# Patient Record
Sex: Female | Born: 2000 | Race: White | Hispanic: No | Smoking: Never smoker
Health system: Southern US, Community
[De-identification: ages and names within clinical notes are randomized; demographics above are authoritative.]

## PROBLEM LIST (undated history)

## (undated) DIAGNOSIS — F419 Anxiety disorder, unspecified: Secondary | ICD-10-CM

## (undated) DIAGNOSIS — F32A Depression, unspecified: Secondary | ICD-10-CM

## (undated) HISTORY — DX: Anxiety disorder, unspecified: F41.9

## (undated) HISTORY — DX: Depression, unspecified: F32.A

---

## 2020-05-13 DIAGNOSIS — Z3483 Encounter for supervision of other normal pregnancy, third trimester: Secondary | ICD-10-CM | POA: Diagnosis not present

## 2020-05-29 DIAGNOSIS — O471 False labor at or after 37 completed weeks of gestation: Secondary | ICD-10-CM | POA: Diagnosis not present

## 2020-05-29 DIAGNOSIS — Z3403 Encounter for supervision of normal first pregnancy, third trimester: Secondary | ICD-10-CM | POA: Diagnosis not present

## 2020-05-30 DIAGNOSIS — O2623 Pregnancy care for patient with recurrent pregnancy loss, third trimester: Secondary | ICD-10-CM | POA: Diagnosis not present

## 2020-05-30 DIAGNOSIS — Z3A38 38 weeks gestation of pregnancy: Secondary | ICD-10-CM | POA: Diagnosis not present

## 2020-05-30 DIAGNOSIS — O99344 Other mental disorders complicating childbirth: Secondary | ICD-10-CM | POA: Diagnosis not present

## 2020-05-30 DIAGNOSIS — Z3A00 Weeks of gestation of pregnancy not specified: Secondary | ICD-10-CM | POA: Diagnosis not present

## 2020-05-30 DIAGNOSIS — F419 Anxiety disorder, unspecified: Secondary | ICD-10-CM | POA: Diagnosis not present

## 2020-05-30 DIAGNOSIS — Z20822 Contact with and (suspected) exposure to covid-19: Secondary | ICD-10-CM | POA: Diagnosis not present

## 2020-05-30 DIAGNOSIS — O4212 Full-term premature rupture of membranes, onset of labor more than 24 hours following rupture: Secondary | ICD-10-CM | POA: Diagnosis not present

## 2020-06-20 DIAGNOSIS — O906 Postpartum mood disturbance: Secondary | ICD-10-CM | POA: Diagnosis not present

## 2020-06-21 DIAGNOSIS — L910 Hypertrophic scar: Secondary | ICD-10-CM | POA: Diagnosis not present

## 2020-06-27 DIAGNOSIS — L910 Hypertrophic scar: Secondary | ICD-10-CM | POA: Diagnosis not present

## 2020-07-22 DIAGNOSIS — Z30430 Encounter for insertion of intrauterine contraceptive device: Secondary | ICD-10-CM | POA: Diagnosis not present

## 2020-07-25 DIAGNOSIS — O906 Postpartum mood disturbance: Secondary | ICD-10-CM | POA: Diagnosis not present

## 2020-07-31 DIAGNOSIS — L910 Hypertrophic scar: Secondary | ICD-10-CM | POA: Diagnosis not present

## 2020-08-05 DIAGNOSIS — Z30430 Encounter for insertion of intrauterine contraceptive device: Secondary | ICD-10-CM | POA: Diagnosis not present

## 2020-08-05 DIAGNOSIS — O906 Postpartum mood disturbance: Secondary | ICD-10-CM | POA: Diagnosis not present

## 2020-08-29 DIAGNOSIS — L910 Hypertrophic scar: Secondary | ICD-10-CM | POA: Diagnosis not present

## 2020-09-02 DIAGNOSIS — Z30431 Encounter for routine checking of intrauterine contraceptive device: Secondary | ICD-10-CM | POA: Diagnosis not present

## 2020-11-21 DIAGNOSIS — L910 Hypertrophic scar: Secondary | ICD-10-CM | POA: Diagnosis not present

## 2020-12-03 DIAGNOSIS — L910 Hypertrophic scar: Secondary | ICD-10-CM | POA: Diagnosis not present

## 2020-12-14 DIAGNOSIS — N390 Urinary tract infection, site not specified: Secondary | ICD-10-CM | POA: Diagnosis not present

## 2020-12-17 ENCOUNTER — Emergency Department
Admission: EM | Admit: 2020-12-17 | Discharge: 2020-12-17 | Disposition: A | Discharge status: Home / Self Care | Payer: BLUE CROSS/BLUE SHIELD | Attending: Emergency Medicine | Admitting: Emergency Medicine

## 2020-12-17 ENCOUNTER — Other Ambulatory Visit: Payer: Self-pay

## 2020-12-17 ENCOUNTER — Encounter: Payer: Self-pay | Admitting: Emergency Medicine

## 2020-12-17 ENCOUNTER — Emergency Department: Payer: BLUE CROSS/BLUE SHIELD

## 2020-12-17 DIAGNOSIS — R1031 Right lower quadrant pain: Secondary | ICD-10-CM | POA: Diagnosis not present

## 2020-12-17 DIAGNOSIS — K7689 Other specified diseases of liver: Secondary | ICD-10-CM | POA: Diagnosis not present

## 2020-12-17 DIAGNOSIS — N83202 Unspecified ovarian cyst, left side: Secondary | ICD-10-CM | POA: Diagnosis not present

## 2020-12-17 DIAGNOSIS — R102 Pelvic and perineal pain: Secondary | ICD-10-CM | POA: Diagnosis not present

## 2020-12-17 DIAGNOSIS — Z87440 Personal history of urinary (tract) infections: Secondary | ICD-10-CM | POA: Diagnosis not present

## 2020-12-17 DIAGNOSIS — N200 Calculus of kidney: Secondary | ICD-10-CM | POA: Diagnosis not present

## 2020-12-17 DIAGNOSIS — L910 Hypertrophic scar: Secondary | ICD-10-CM | POA: Diagnosis not present

## 2020-12-17 DIAGNOSIS — R1030 Lower abdominal pain, unspecified: Secondary | ICD-10-CM

## 2020-12-17 LAB — URINALYSIS, COMPLETE (UACMP) WITH MICROSCOPIC
Bilirubin Urine: NEGATIVE
Glucose, UA: NEGATIVE mg/dL
Ketones, ur: NEGATIVE mg/dL
Nitrite: NEGATIVE
Protein, ur: NEGATIVE mg/dL
Specific Gravity, Urine: 1.015 (ref 1.005–1.030)
pH: 6 (ref 5.0–8.0)

## 2020-12-17 LAB — WET PREP, GENITAL
Clue Cells Wet Prep HPF POC: NONE SEEN
Sperm: NONE SEEN
Trich, Wet Prep: NONE SEEN
Yeast Wet Prep HPF POC: NONE SEEN

## 2020-12-17 LAB — CHLAMYDIA/NGC RT PCR (ARMC ONLY)
Chlamydia Tr: NOT DETECTED
N gonorrhoeae: NOT DETECTED

## 2020-12-17 LAB — POC URINE PREG, ED: Preg Test, Ur: NEGATIVE

## 2020-12-17 MED ORDER — SODIUM CHLORIDE 0.9 % IV SOLN: 2.0000 g | INTRAVENOUS | Status: DC

## 2020-12-17 MED ORDER — IBUPROFEN 600 MG PO TABS
600.0000 mg | ORAL_TABLET | Freq: Once | ORAL | Status: AC
  Administered 2020-12-17: 600 mg via ORAL
  Filled 2020-12-17: qty 1

## 2020-12-17 MED ORDER — HYDROCODONE-ACETAMINOPHEN 5-325 MG PO TABS
1.0000 | ORAL_TABLET | Freq: Once | ORAL | Status: AC
  Administered 2020-12-17: 1 via ORAL
  Filled 2020-12-17: qty 1

## 2020-12-17 MED ORDER — HYDROCODONE-ACETAMINOPHEN 5-325 MG PO TABS: 1.0000 | ORAL_TABLET | ORAL | 0 refills | Status: AC | PRN

## 2020-12-17 NOTE — ED Provider Notes (Signed)
-----------------------------------------   9:36 PM on 12/17/2020 -----------------------------------------  Patient seen in conjunction with physician assistant Greig Right.  I have personally seen and evaluated the patient.  Patient has mild right very low tenderness to palpation.  Patient's work-up is overall reassuring.  She does have small cysts on her ovary CT scans essentially negative for acute abnormality.  Given the patient's reassuring work-up I believe the patient is safe for discharge home.  We will prescribe a short course of pain medication have her follow-up with her PCP.  Patient agreeable to plan of care.   Minna Antis, MD 12/17/20 2137

## 2020-12-17 NOTE — ED Triage Notes (Signed)
Pt to ED via POV with c/o lower abd/pelvic pain x 2 weeks, pt seen at [facility] on Sunday and dx with UTI and given abx and told to come to ED if symptoms persisted.

## 2020-12-17 NOTE — ED Provider Notes (Signed)
[facility] Emergency Department Provider Note  ____________________________________________   Event Date/Time   First MD Initiated Contact with Patient 12/17/20 1638     (approximate)  I have reviewed the triage vital signs and the nursing notes.   HISTORY  Chief Complaint Pelvic Pain    HPI Linda Duran is a 20 y.o. female presents emergency department complaining of right lower quadrant pain.  States she has had lower abdominal pain for 2 weeks.  Seen in urgent care on Sunday and diagnosed with a UTI and given antibiotic.  States pain is become worse.  She denies fever or chills.  No vomiting or diarrhea.  No vaginal discharge.  Patient states she has IUD and has not changed sexual partners    History reviewed. No pertinent past medical history.  There are no problems to display for this patient.   History reviewed. No pertinent surgical history.  Prior to Admission medications   Not on File    Allergies Patient has no known allergies.  No family history on file.  Social History Social History   Tobacco Use  . Smoking status: Never Smoker  . Smokeless tobacco: Never Used  Substance Use Topics  . Alcohol use: Yes    Comment: 1 glass wine/week  . Drug use: Never    Review of Systems  Constitutional: No fever/chills Eyes: No visual changes. ENT: No sore throat. Respiratory: Denies cough Cardiovascular: Denies chest pain Gastrointestinal: Positive abdominal pain Genitourinary: Negative for dysuria. Musculoskeletal: Negative for back pain. Skin: Negative for rash. Psychiatric: no mood changes,     ____________________________________________   PHYSICAL EXAM:  VITAL SIGNS: ED Triage Vitals [12/17/20 1635]  Enc Vitals Group     BP (!) 152/93     Pulse Rate 95     Resp 20     Temp 98.4 F (36.9 C)     Temp Source Oral     SpO2 96 %     Weight 180 lb (81.6 kg)     Height 5\' 4"  (1.626 m)     Head Circumference       Peak Flow      Pain Score 8     Pain Loc      Pain Edu?      Excl. in GC?     Constitutional: Alert and oriented. Well appearing and in no acute distress. Eyes: Conjunctivae are normal.  Head: Atraumatic. Nose: No congestion/rhinnorhea. Mouth/Throat: Mucous membranes are moist.   Neck:  supple no lymphadenopathy noted Cardiovascular: Normal rate, regular rhythm. Heart sounds are normal Respiratory: Normal respiratory effort.  No retractions, lungs c t a  Abd: soft tender in the right lower quadrant, bs normal all 4 quad GU: External pelvic exam does not show any herpetic lesions, slight discharge noted, bimanual shows the right side of the abdomen to be tender little bit higher than the ovary Musculoskeletal: FROM all extremities, warm and well perfused Neurologic:  Normal speech and language.  Skin:  Skin is warm, dry and intact. No rash noted. Psychiatric: Mood and affect are normal. Speech and behavior are normal.  ____________________________________________   LABS (all labs ordered are listed, but only abnormal results are displayed)  Labs Reviewed  CHLAMYDIA/NGC RT PCR ([facility] ONLY)  WET PREP, GENITAL  POC URINE PREG, ED   ____________________________________________   ____________________________________________  RADIOLOGY  Ultrasound pelvis  ____________________________________________   PROCEDURES  Procedure(s) performed: No  Procedures    ____________________________________________   INITIAL IMPRESSION / ASSESSMENT  AND PLAN / ED COURSE  Pertinent labs & imaging results that were available during my care of the patient were reviewed by me and considered in my medical decision making (see chart for details).   Patient is 20 year old female presents with pelvic pain.  See HPI.  Physical exam shows patient stable  DDx: STD, PID, UTI, ovarian cyst, acute appendicitis  Urinalysis has small amount of hemoglobin, small amount of leuks, and  rare bacteria, is amorphus crystals noted, wet prep shows few WBCs, POC pregnancy is negative GC/committee is pending  Ultrasound pelvis complete with transvaginal torsion r/o   Ultrasound of the pelvis does not show torsion.  Does show a left ovarian cyst.  This was reviewed by me confirmed by radiology  On reexamination the patient continues to be tender in the right lower quadrant.  At this time I feel this warrants CT abdomen/pelvis.  Will do without contrast secondary to contrast shortage.  Care is being transferred to Dr. Gearldine Bienenstock at this time.  Linda Duran was evaluated in Emergency Department on 12/17/2020 for the symptoms described in the history of present illness. She was evaluated in the context of the global COVID-19 pandemic, which necessitated consideration that the patient might be at risk for infection with the SARS-CoV-2 virus that causes COVID-19. Institutional protocols and algorithms that pertain to the evaluation of patients at risk for COVID-19 are in a state of rapid change based on information released by regulatory bodies including the CDC and federal and state organizations. These policies and algorithms were followed during the patient's care in the ED.    As part of my medical decision making, I reviewed the following data within the electronic MEDICAL RECORD NUMBER Nursing notes reviewed and incorporated, Labs reviewed , Old chart reviewed, Patient signed out to , Radiograph reviewed , Evaluated by EM attending , Notes from prior ED visits and [place] Controlled Substance Database  ____________________________________________   FINAL CLINICAL IMPRESSION(S) / ED DIAGNOSES  Final diagnoses:  Lower abdominal pain      NEW MEDICATIONS STARTED DURING THIS VISIT:  New Prescriptions   No medications on file     Note:  This document was prepared using Dragon voice recognition software and may include unintentional dictation errors.    Faythe Ghee, PA-C 12/17/20  1934    Minna Antis, MD 12/17/20 1945

## 2021-01-14 DIAGNOSIS — L910 Hypertrophic scar: Secondary | ICD-10-CM | POA: Diagnosis not present

## 2021-03-27 DIAGNOSIS — L910 Hypertrophic scar: Secondary | ICD-10-CM | POA: Diagnosis not present

## 2021-06-18 ENCOUNTER — Ambulatory Visit
Admission: EM | Admit: 2021-06-18 | Discharge: 2021-06-18 | Disposition: A | Discharge status: Home / Self Care | Payer: BLUE CROSS/BLUE SHIELD | Attending: Family Medicine | Admitting: Family Medicine

## 2021-06-18 ENCOUNTER — Encounter: Payer: Self-pay | Admitting: Emergency Medicine

## 2021-06-18 ENCOUNTER — Other Ambulatory Visit: Payer: Self-pay

## 2021-06-18 DIAGNOSIS — J101 Influenza due to other identified influenza virus with other respiratory manifestations: Secondary | ICD-10-CM

## 2021-06-18 MED ORDER — PROMETHAZINE-DM 6.25-15 MG/5ML PO SYRP: 5.0000 mL | ORAL_SOLUTION | Freq: Four times a day (QID) | ORAL | 0 refills | Status: DC | PRN

## 2021-06-18 MED ORDER — FLUTICASONE PROPIONATE 50 MCG/ACT NA SUSP: 1.0000 | Freq: Two times a day (BID) | NASAL | 0 refills | Status: DC

## 2021-06-18 NOTE — ED Triage Notes (Signed)
Pt reports was diagnosed with flu A on Monday and reports is not feeling better. Pt reports continued bilateral ear pain, cough, body aches.

## 2021-06-18 NOTE — ED Provider Notes (Signed)
[facility]    CSN: [id number] Arrival date & time: 06/18/21  0813      History   Chief Complaint Chief Complaint  Patient presents with   Generalized Body Aches    HPI Linda Duran is a 20 y.o. female.   Presenting today with 5-day history of fever, body aches, chills, cough, congestion, headache, fatigue, decreased appetite.  She was seen on Monday at a different urgent care and diagnosed with influenza.  She has been taking over-the-counter cold and flu medications, Benadryl with minimal relief of symptoms.  She was to return to work tomorrow but is not feeling any better.  She denies significant chest pain, shortness of breath, intolerance to p.o.  No known pertinent chronic medical problems.   History reviewed. No pertinent past medical history.  There are no problems to display for this patient.   History reviewed. No pertinent surgical history.  OB History   No obstetric history on file.      Home Medications    Prior to Admission medications   Medication Sig Start Date End Date Taking? Authorizing Provider  fluticasone (FLONASE) 50 MCG/ACT nasal spray Place 1 spray into both nostrils 2 (two) times daily. 06/18/21  Yes Particia Nearing, PA-C  promethazine-dextromethorphan (PROMETHAZINE-DM) 6.25-15 MG/5ML syrup Take 5 mLs by mouth 4 (four) times daily as needed. 06/18/21  Yes Particia Nearing, PA-C  HYDROcodone-acetaminophen (NORCO/VICODIN) 5-325 MG tablet Take 1 tablet by mouth every 4 (four) hours as needed for moderate pain. Patient not taking: Reported on 06/18/2021 12/17/20 12/17/21  Minna Antis, MD    Family History History reviewed. No pertinent family history.  Social History Social History   Tobacco Use   Smoking status: Never   Smokeless tobacco: Never  Substance Use Topics   Alcohol use: Yes    Comment: 1 glass wine/week   Drug use: Never     Allergies   Patient has no known allergies.   Review of Systems Review  of Systems Per HPI  Physical Exam Triage Vital Signs ED Triage Vitals [06/18/21 0847]  Enc Vitals Group     BP (!) 97/58     Pulse Rate 100     Resp 18     Temp 98.4 F (36.9 C)     Temp Source Oral     SpO2 97 %     Weight 180 lb (81.6 kg)     Height 5\' 5"  (1.651 m)     Head Circumference      Peak Flow      Pain Score 4     Pain Loc      Pain Edu?      Excl. in GC?    No data found.  Updated Vital Signs BP (!) 97/58 (BP Location: Right Arm)   Pulse 100   Temp 98.4 F (36.9 C) (Oral)   Resp 18   Ht 5\' 5"  (1.651 m)   Wt 180 lb (81.6 kg)   SpO2 97%   BMI 29.95 kg/m   Visual Acuity Right Eye Distance:   Left Eye Distance:   Bilateral Distance:    Right Eye Near:   Left Eye Near:    Bilateral Near:     Physical Exam Vitals and nursing note reviewed.  Constitutional:      Appearance: Normal appearance.  HENT:     Head: Atraumatic.     Right Ear: Tympanic membrane and external ear normal.     Left Ear: Tympanic membrane  and external ear normal.     Nose: Rhinorrhea present.     Mouth/Throat:     Mouth: Mucous membranes are moist.     Pharynx: Posterior oropharyngeal erythema present.  Eyes:     Extraocular Movements: Extraocular movements intact.     Conjunctiva/sclera: Conjunctivae normal.  Cardiovascular:     Rate and Rhythm: Normal rate and regular rhythm.     Heart sounds: Normal heart sounds.  Pulmonary:     Effort: Pulmonary effort is normal.     Breath sounds: Normal breath sounds.  Abdominal:     General: Bowel sounds are normal. There is no distension.     Palpations: Abdomen is soft.     Tenderness: There is no abdominal tenderness. There is no guarding.  Musculoskeletal:        General: Normal range of motion.     Cervical back: Normal range of motion and neck supple.  Skin:    General: Skin is warm and dry.  Neurological:     Mental Status: She is alert and oriented to person, place, and time.  Psychiatric:        Mood and Affect:  Mood normal.        Thought Content: Thought content normal.     UC Treatments / Results  Labs (all labs ordered are listed, but only abnormal results are displayed) Labs Reviewed - No data to display  EKG   Radiology No results found.  Procedures Procedures (including critical care time)  Medications Ordered in UC Medications - No data to display  Initial Impression / Assessment and Plan / UC Course  I have reviewed the triage vital signs and the nursing notes.  Pertinent labs & imaging results that were available during my care of the patient were reviewed by me and considered in my medical decision making (see chart for details).     Overall vital signs and exam reassuring, and symptoms do appear to be slowly improving.  She has not had a fever since 4 days ago and it is mainly the cough that is lingering.  We will treat with Phenergan DM, Flonase, continued over-the-counter supportive medications and home care.  Work note extended.  Return for worsening symptoms.  Final Clinical Impressions(s) / UC Diagnoses   Final diagnoses:  Influenza A   Discharge Instructions   None    ED Prescriptions     Medication Sig Dispense Auth. Provider   promethazine-dextromethorphan (PROMETHAZINE-DM) 6.25-15 MG/5ML syrup Take 5 mLs by mouth 4 (four) times daily as needed. 100 mL Particia Nearing, PA-C   fluticasone [facility]) 50 MCG/ACT nasal spray Place 1 spray into both nostrils 2 (two) times daily. 16 g Particia Nearing, [place]      PDMP not reviewed this encounter.   Roosvelt Maser Me[place], [place] 06/18/21 [phone]

## 2021-10-15 DIAGNOSIS — J019 Acute sinusitis, unspecified: Secondary | ICD-10-CM | POA: Diagnosis not present

## 2021-10-15 DIAGNOSIS — B9689 Other specified bacterial agents as the cause of diseases classified elsewhere: Secondary | ICD-10-CM | POA: Diagnosis not present

## 2021-11-28 DIAGNOSIS — F411 Generalized anxiety disorder: Secondary | ICD-10-CM | POA: Diagnosis not present

## 2021-11-28 DIAGNOSIS — F331 Major depressive disorder, recurrent, moderate: Secondary | ICD-10-CM | POA: Diagnosis not present

## 2021-12-11 DIAGNOSIS — F411 Generalized anxiety disorder: Secondary | ICD-10-CM | POA: Diagnosis not present

## 2021-12-11 DIAGNOSIS — F331 Major depressive disorder, recurrent, moderate: Secondary | ICD-10-CM | POA: Diagnosis not present

## 2021-12-26 DIAGNOSIS — F411 Generalized anxiety disorder: Secondary | ICD-10-CM | POA: Diagnosis not present

## 2021-12-26 DIAGNOSIS — F331 Major depressive disorder, recurrent, moderate: Secondary | ICD-10-CM | POA: Diagnosis not present

## 2022-01-06 DIAGNOSIS — F411 Generalized anxiety disorder: Secondary | ICD-10-CM | POA: Diagnosis not present

## 2022-01-06 DIAGNOSIS — F331 Major depressive disorder, recurrent, moderate: Secondary | ICD-10-CM | POA: Diagnosis not present

## 2022-01-20 DIAGNOSIS — F331 Major depressive disorder, recurrent, moderate: Secondary | ICD-10-CM | POA: Diagnosis not present

## 2022-01-20 DIAGNOSIS — F411 Generalized anxiety disorder: Secondary | ICD-10-CM | POA: Diagnosis not present

## 2022-01-23 DIAGNOSIS — S8011XA Contusion of right lower leg, initial encounter: Secondary | ICD-10-CM | POA: Diagnosis not present

## 2022-01-23 DIAGNOSIS — M79661 Pain in right lower leg: Secondary | ICD-10-CM | POA: Diagnosis not present

## 2022-02-12 DIAGNOSIS — F331 Major depressive disorder, recurrent, moderate: Secondary | ICD-10-CM | POA: Diagnosis not present

## 2022-02-12 DIAGNOSIS — F411 Generalized anxiety disorder: Secondary | ICD-10-CM | POA: Diagnosis not present

## 2022-02-23 ENCOUNTER — Encounter: Payer: Self-pay | Admitting: Emergency Medicine

## 2022-02-23 ENCOUNTER — Ambulatory Visit
Admission: EM | Admit: 2022-02-23 | Discharge: 2022-02-23 | Disposition: A | Discharge status: Home / Self Care | Payer: BC Managed Care – PPO | Attending: Nurse Practitioner | Admitting: Nurse Practitioner

## 2022-02-23 DIAGNOSIS — R42 Dizziness and giddiness: Secondary | ICD-10-CM | POA: Insufficient documentation

## 2022-02-23 DIAGNOSIS — R110 Nausea: Secondary | ICD-10-CM | POA: Insufficient documentation

## 2022-02-23 DIAGNOSIS — R5383 Other fatigue: Secondary | ICD-10-CM | POA: Diagnosis not present

## 2022-02-23 LAB — POCT URINALYSIS DIP (MANUAL ENTRY)
Bilirubin, UA: NEGATIVE
Glucose, UA: NEGATIVE mg/dL
Ketones, POC UA: NEGATIVE mg/dL
Nitrite, UA: NEGATIVE
Protein Ur, POC: NEGATIVE mg/dL
Spec Grav, UA: 1.025 (ref 1.010–1.025)
Urobilinogen, UA: 0.2 E.U./dL
pH, UA: 6 (ref 5.0–8.0)

## 2022-02-23 LAB — POCT MONO SCREEN (KUC): Mono, POC: NEGATIVE

## 2022-02-23 MED ORDER — ONDANSETRON HCL 4 MG PO TABS: 4.0000 mg | ORAL_TABLET | Freq: Three times a day (TID) | ORAL | 0 refills | Status: DC | PRN

## 2022-02-23 NOTE — ED Triage Notes (Signed)
Pt presents with insect bite xs 9 days. States never found tick on her but noticed a bullseye rash on R groin area. States currently having dizziness, fatigue, body pain and nausea.

## 2022-02-23 NOTE — Discharge Instructions (Addendum)
The Monospot is negative.  Your urinalysis is being sent for urine culture.  If the results are positive, you will be contacted and provided treatment. Increase fluids and allow for plenty of rest.  You should be drinking at least 8-10 8 ounce glasses of water daily. Continue over-the-counter Tylenol or ibuprofen as needed for pain.  Recommend taking the medications every 8 hours.  If you take ibuprofen, take medication with food and water. If your symptoms worsen or if they do not improve within the next 3 to 5 days, please go to the emergency department for further evaluation.

## 2022-02-23 NOTE — ED Provider Notes (Addendum)
[facility]    CSN: [id number] Arrival date & time: 02/23/22  1338      History   Chief Complaint Chief Complaint  Patient presents with   Insect Bite   Dizziness   Fatigue   Nausea    HPI Linda Duran is a 21 y.o. female.   The history is provided by the patient.   Patient presents for complaints of a bull's-eye rash to the right groin, fatigue, dizziness, lightheadedness, and generalized pain.  Patient states symptoms started approximately 9 days ago after she found the rash.  She states that she never saw a tick, but according to her symptoms, the Internet indicates that she may have had a tick bite.  The rash is no longer present per the patient. She also states that the other diagnosis that she may have is mono.  She denies fever, chills, upper respiratory symptoms, nausea, vomiting, or diarrhea.  Patient states she has been taking over-the-counter Tylenol and ibuprofen for her symptoms.  Patient is also requesting a work note.  History reviewed. No pertinent past medical history.  There are no problems to display for this patient.   History reviewed. No pertinent surgical history.  OB History   No obstetric history on file.      Home Medications    Prior to Admission medications   Medication Sig Start Date End Date Taking? Authorizing Provider  ondansetron (ZOFRAN) 4 MG tablet Take 1 tablet (4 mg total) by mouth every 8 (eight) hours as needed for nausea or vomiting. 02/23/22  Yes Othell Diluzio-Warren, Sadie Haber, NP  fluticasone (FLONASE) 50 MCG/ACT nasal spray Place 1 spray into both nostrils 2 (two) times daily. 06/18/21   Particia Nearing, PA-C  promethazine-dextromethorphan (PROMETHAZINE-DM) 6.25-15 MG/5ML syrup Take 5 mLs by mouth 4 (four) times daily as needed. 06/18/21   Particia Nearing, PA-C    Family History History reviewed. No pertinent family history.  Social History Social History   Tobacco Use   Smoking status: Never    Smokeless tobacco: Never  Substance Use Topics   Alcohol use: Yes    Comment: 1 glass wine/week   Drug use: Never     Allergies   Patient has no known allergies.   Review of Systems Review of Systems  Constitutional:  Positive for activity change and fatigue. Negative for appetite change and fever.  HENT: Negative.    Eyes: Negative.   Respiratory: Negative.    Cardiovascular: Negative.   Gastrointestinal: Negative.   Musculoskeletal: Negative.   Skin: Negative.   Neurological:  Positive for dizziness and headaches.  Psychiatric/Behavioral: Negative.      Physical Exam Triage Vital Signs ED Triage Vitals [02/23/22 1354]  Enc Vitals Group     BP 128/82     Pulse Rate 85     Resp 17     Temp 99.3 F (37.4 C)     Temp Source Oral     SpO2 95 %     Weight      Height      Head Circumference      Peak Flow      Pain Score 8     Pain Loc      Pain Edu?      Excl. in GC?    No data found.  Updated Vital Signs BP 128/82 (BP Location: Right Arm)   Pulse 85   Temp 99.3 F (37.4 C) (Oral)   Resp 17   SpO2 95%  Visual Acuity Right Eye Distance:   Left Eye Distance:   Bilateral Distance:    Right Eye Near:   Left Eye Near:    Bilateral Near:     Physical Exam Vitals and nursing note reviewed.  Constitutional:      General: She is not in acute distress.    Appearance: Normal appearance.  HENT:     Head: Normocephalic.     Right Ear: Tympanic membrane, ear canal and external ear normal.     Left Ear: Tympanic membrane, ear canal and external ear normal.     Nose: Nose normal.     Mouth/Throat:     Mouth: Mucous membranes are moist.  Eyes:     Extraocular Movements: Extraocular movements intact.     Conjunctiva/sclera: Conjunctivae normal.     Pupils: Pupils are equal, round, and reactive to light.  Cardiovascular:     Rate and Rhythm: Normal rate and regular rhythm.     Pulses: Normal pulses.     Heart sounds: Normal heart sounds.  Pulmonary:      Effort: Pulmonary effort is normal. No respiratory distress.     Breath sounds: Normal breath sounds. No stridor. No wheezing, rhonchi or rales.  Abdominal:     General: Bowel sounds are normal.     Palpations: Abdomen is soft.     Tenderness: There is no right CVA tenderness, left CVA tenderness or guarding.  Musculoskeletal:     Cervical back: Normal range of motion and neck supple.  Neurological:     General: No focal deficit present.     Mental Status: She is alert and oriented to person, place, and time.     GCS: GCS eye subscore is 4. GCS verbal subscore is 5. GCS motor subscore is 6.     Cranial Nerves: Cranial nerves 2-12 are intact.     Sensory: Sensation is intact.     Motor: Motor function is intact.     Coordination: Coordination is intact.  Psychiatric:        Attention and Perception: Attention normal.        Mood and Affect: Mood is anxious.        Speech: Speech normal.        Behavior: Behavior is agitated.        Thought Content: Thought content normal.        Cognition and Memory: Cognition and memory normal.        Judgment: Judgment normal.      UC Treatments / Results  Labs (all labs ordered are listed, but only abnormal results are displayed) Labs Reviewed  POCT URINALYSIS DIP (MANUAL ENTRY) - Abnormal; Notable for the following components:      Result Value   Clarity, UA cloudy (*)    Blood, UA trace-intact (*)    Leukocytes, UA Trace (*)    All other components within normal limits  URINE CULTURE  POCT MONO SCREEN ([facility])    EKG   Radiology No results found.  Procedures Procedures (including critical care time)  Medications Ordered in UC Medications - No data to display  Initial Impression / Assessment and Plan / UC Course  I have reviewed the triage vital signs and the nursing notes.  Pertinent labs & imaging results that were available during my care of the patient were reviewed by me and considered in my medical decision making (see  chart for details).  Patient presents with various symptoms to include headache, fatigue, dizziness,  and nausea.  Patient's vital signs are stable, she is in no acute distress, exam is reassuring at this time. Discussed with the patient that due to the symptoms, recommend follow-up in the emergency department since her symptoms have been present for the past 9 days. Patient states she cannot go to the ER because of the cost. Reiterated the option of going to the ER, patient states, "she needed a work note". Patient was in agreement to a urinalysis and monospot test. Monospot was performed which was negative.  Urinalysis did show trace blood and trace leukocytes, urine culture is pending.  Discussed with patient that due to the limited resources if her symptoms do not improve, recommend that she follow-up in the emergency department.  Supportive care recommendations were provided to the patient today, along with a work note.  Patient verbalizes understanding.  All questions were answered. Final Clinical Impressions(s) / UC Diagnoses   Final diagnoses:  Other fatigue  Dizziness and giddiness  Nausea without vomiting     Discharge Instructions      The Monospot is negative.  Your urinalysis is being sent for urine culture.  If the results are positive, you will be contacted and provided treatment. Increase fluids and allow for plenty of rest.  You should be drinking at least 8-10 8 ounce glasses of water daily. Continue over-the-counter Tylenol or ibuprofen as needed for pain.  Recommend taking the medications every 8 hours.  If you take ibuprofen, take medication with food and water. If your symptoms worsen or if they do not improve within the next 3 to 5 days, please go to the emergency department for further evaluation.     ED Prescriptions     Medication Sig Dispense Auth. Provider   ondansetron (ZOFRAN) 4 MG tablet Take 1 tablet (4 mg total) by mouth every 8 (eight) hours as needed for  nausea or vomiting. 20 tablet Nariah Morgano-Warren, Sadie Haber, NP      PDMP not reviewed this encounter.   Abran Cantor, NP 02/23/22 1455    Joshoa Shawler-Warren, Sadie Haber, NP 02/23/22 1555

## 2022-02-25 LAB — URINE CULTURE: Culture: 50000 — AB

## 2022-02-26 ENCOUNTER — Encounter: Payer: Self-pay | Admitting: Nurse Practitioner

## 2022-02-26 ENCOUNTER — Ambulatory Visit: Payer: BC Managed Care – PPO | Admitting: Nurse Practitioner

## 2022-02-26 VITALS — BP 132/88 | HR 75 | Ht 65.0 in | Wt 186.0 lb

## 2022-02-26 DIAGNOSIS — F411 Generalized anxiety disorder: Secondary | ICD-10-CM | POA: Diagnosis not present

## 2022-02-26 DIAGNOSIS — F331 Major depressive disorder, recurrent, moderate: Secondary | ICD-10-CM | POA: Diagnosis not present

## 2022-02-26 DIAGNOSIS — F419 Anxiety disorder, unspecified: Secondary | ICD-10-CM | POA: Diagnosis not present

## 2022-02-26 DIAGNOSIS — F32A Depression, unspecified: Secondary | ICD-10-CM

## 2022-02-26 DIAGNOSIS — W57XXXA Bitten or stung by nonvenomous insect and other nonvenomous arthropods, initial encounter: Secondary | ICD-10-CM | POA: Diagnosis not present

## 2022-02-26 DIAGNOSIS — R42 Dizziness and giddiness: Secondary | ICD-10-CM | POA: Diagnosis not present

## 2022-02-26 DIAGNOSIS — R5383 Other fatigue: Secondary | ICD-10-CM | POA: Diagnosis not present

## 2022-02-26 DIAGNOSIS — S30860A Insect bite (nonvenomous) of lower back and pelvis, initial encounter: Secondary | ICD-10-CM | POA: Diagnosis not present

## 2022-02-26 MED ORDER — DOXYCYCLINE HYCLATE 100 MG PO TABS: 100.0000 mg | ORAL_TABLET | Freq: Two times a day (BID) | ORAL | 0 refills | Status: DC

## 2022-02-26 NOTE — Assessment & Plan Note (Signed)
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  Stated that she has tried different medications including Lexapro Zoloft Cymbalta had serious side effects from all of these medication .last visit to psych was 3 weeks ago,she missed her last appointment , will call their office to reschedule the visit. patient denies SI, HI Pt encouraged to maintain close follow up with psyc , she verbalized understanding.

## 2022-02-26 NOTE — Assessment & Plan Note (Signed)
Possible tick bite  Check lyme disease serology [place] spotted fvr Will empirically treat  with doxycycline 100 mg twice daily for 10 days

## 2022-02-26 NOTE — Assessment & Plan Note (Signed)
Possible tick bite  Check lyme disease serology [place] spotted fvr CBC, CMP, TSH

## 2022-02-26 NOTE — Assessment & Plan Note (Signed)
Possible tick bite  Check lyme disease serology [place] m[place] spotted fvr Will empirically treat  with doxycycline 100 mg twice daily for 10 days Check CBC, TSH, CMP to rule out other causes of fatigue

## 2022-02-26 NOTE — Patient Instructions (Signed)
Please get your labs done as dicussed   It is important that you exercise regularly at least 30 minutes 5 times a week.  Think about what you will eat, plan ahead. Choose " clean, green, fresh or frozen" over canned, processed or packaged foods which are more sugary, salty and fatty. 70 to 75% of food eaten should be vegetables and fruit. Three meals at set times with snacks allowed between meals, but they must be fruit or vegetables. Aim to eat over a 12 hour period , example 7 am to 7 pm, and STOP after  your last meal of the day. Drink water,generally about 64 ounces per day, no other drink is as healthy. Fruit juice is best enjoyed in a healthy way, by EATING the fruit.  Thanks for choosing [facility], we consider it a privelige to serve you.

## 2022-02-26 NOTE — Progress Notes (Addendum)
New Patient Office Visit  Subjective    Patient ID: Linda Duran, female    DOB: 2000/08/21  Age: 21 y.o. MRN: [id number]  CC:  Chief Complaint  Patient presents with   New Patient (Initial Visit)    np   Insect Bite    Tick bit 8/6   Fatigue    Since 8/6   Nausea    Since 8/6   Dizziness    Since 8/6    HPI Linda Duran with PMH of anxiety and depression presents to establish care. Has no previous PCP   Pt c/o fatigue, nausea, muscle aches that started on 02/15/2022  after she found a bull eye rash on her right groin. She thinks that she may have had a tick bite . She denies seeing a tick . Patient states that her rash has since resolved. She denies fever, chills, vomiting, confusion, chest pain, numbness, tingling.     Has an appointment with OBGYN for PAP and annual exam , states that she has had the HPV vaccines, had TDAP vaccine about 2 years ago. Pt encouraged to bring her vaccine card with her to her next appointment so we can update her records.   Depression and anxiety.  Chronic condition sees a psychiatrist and counselor goes to [facility] in [place] .  Stated that she has tried different medications including Lexapro Zoloft Cymbalta had serious side effects from all of these medication .last visit to psych was 3 weeks ago,she missed her last appointment , will call their office to reschedule the visit. patient denies SI, HI.   Outpatient Encounter Medications as of 02/26/2022  Medication Sig   doxycycline (VIBRA-TABS) 100 MG tablet Take 1 tablet (100 mg total) by mouth 2 (two) times daily.   fluticasone (FLONASE) 50 MCG/ACT nasal spray Place 1 spray into both nostrils 2 (two) times daily. (Patient not taking: Reported on 02/26/2022)   ondansetron (ZOFRAN) 4 MG tablet Take 1 tablet (4 mg total) by mouth every 8 (eight) hours as needed for nausea or vomiting. (Patient not taking: Reported on 02/26/2022)   [DISCONTINUED] promethazine-dextromethorphan  (PROMETHAZINE-DM) 6.25-15 MG/5ML syrup Take 5 mLs by mouth 4 (four) times daily as needed.   No facility-administered encounter medications on file as of 02/26/2022.    Past Medical History:  Diagnosis Date   Anxiety    Depression     History reviewed. No pertinent surgical history.  Family History  Problem Relation Age of Onset   Depression Father    Stroke Paternal Grandmother     Social History   Socioeconomic History   Marital status: Legally Separated    Spouse name: Not on file   Number of children: 1   Years of education: Not on file   Highest education level: Not on file  Occupational History   Not on file  Tobacco Use   Smoking status: Never   Smokeless tobacco: Never  Substance and Sexual Activity   Alcohol use: Yes    Comment: 1 glass wine/week   Drug use: Never   Sexual activity: Yes  Other Topics Concern   Not on file  Social History Narrative   Lives with her daughter    Social Determinants of Health   Financial Resource Strain: Not on file  Food Insecurity: Not on file  Transportation Needs: Not on file  Physical Activity: Not on file  Stress: Not on file  Social Connections: Not on file  Intimate Partner Violence: Not on file  Review of Systems  Constitutional:  Positive for malaise/fatigue. Negative for chills, diaphoresis, fever and weight loss.  Respiratory:  Negative for cough, hemoptysis, sputum production, shortness of breath and wheezing.   Cardiovascular:  Negative for chest pain, palpitations, orthopnea, claudication, leg swelling and PND.  Gastrointestinal:  Positive for nausea. Negative for abdominal pain, blood in stool, constipation, diarrhea, heartburn, melena and vomiting.  Genitourinary:  Negative for dysuria, flank pain, frequency, hematuria and urgency.  Musculoskeletal:  Positive for myalgias.  Skin:  Negative for itching and rash.  Neurological:  Positive for dizziness. Negative for tingling, tremors, sensory change,  speech change, focal weakness and headaches.  Psychiatric/Behavioral:  Positive for depression. Negative for hallucinations, memory loss, substance abuse and suicidal ideas. The patient is nervous/anxious. The patient does not have insomnia.         Objective    BP 132/88 (BP Location: Left Arm, Patient Position: Sitting, Cuff Size: Normal)   Pulse 75   Ht '5\' 5"'  (1.651 m)   Wt 186 lb (84.4 kg)   SpO2 97%   BMI 30.95 kg/m   Physical Exam Constitutional:      General: She is not in acute distress.    Appearance: She is obese. She is not ill-appearing, toxic-appearing or diaphoretic.  HENT:     Right Ear: External ear normal.     Left Ear: External ear normal.     Nose: No congestion or rhinorrhea.     Mouth/Throat:     Mouth: Mucous membranes are moist.     Pharynx: Oropharynx is clear.  Eyes:     General: No scleral icterus.       Right eye: No discharge.        Left eye: No discharge.     Extraocular Movements: Extraocular movements intact.  Cardiovascular:     Rate and Rhythm: Normal rate and regular rhythm.     Pulses: Normal pulses.     Heart sounds: Normal heart sounds. No murmur heard.    No friction rub. No gallop.  Pulmonary:     Effort: Pulmonary effort is normal. No respiratory distress.     Breath sounds: Normal breath sounds. No stridor. No wheezing, rhonchi or rales.  Chest:     Chest wall: No tenderness.  Abdominal:     General: There is no distension.     Palpations: Abdomen is soft. There is no mass.     Tenderness: There is no right CVA tenderness, left CVA tenderness, guarding or rebound.     Hernia: No hernia is present.  Skin:    General: Skin is warm and dry.     Capillary Refill: Capillary refill takes less than 2 seconds.     Coloration: Skin is not jaundiced or pale.     Findings: No bruising or lesion.  Neurological:     Mental Status: She is oriented to person, place, and time.     GCS: GCS eye subscore is 4. GCS verbal subscore is 5.  GCS motor subscore is 6.     Cranial Nerves: No cranial nerve deficit, dysarthria or facial asymmetry.     Sensory: No sensory deficit.     Motor: No weakness, tremor, abnormal muscle tone or seizure activity.     Coordination: Romberg sign negative. Coordination normal.     Gait: Gait is intact. Gait normal.  Psychiatric:        Mood and Affect: Mood normal.        Behavior: Behavior normal.  Thought Content: Thought content normal.        Judgment: Judgment normal.         Assessment & Plan:   Problem List Items Addressed This Visit       Musculoskeletal and Integument   Tick bite of pelvic region - Primary    Possible tick bite  Check lyme disease serology Rocky mtn spotted fvr Will empirically treat  with doxycycline 100 mg twice daily for 10 days      Relevant Medications   doxycycline (VIBRA-TABS) 100 MG tablet   Other Relevant Orders   Lyme Disease Serology w/Reflex   Rocky mtn spotted fvr ab, IgG-blood     Other   Fatigue    Possible tick bite  Check lyme disease serology Rocky mtn spotted fvr Will empirically treat  with doxycycline 100 mg twice daily for 10 days Check CBC, TSH, CMP to rule out other causes of fatigue      Relevant Orders   CBC   TSH   BMP8+EGFR   Dizziness    Possible tick bite  Check lyme disease serology Rocky mtn spotted fvr CBC, CMP, TSH       No follow-ups on file.   Renee Rival, FNP

## 2022-02-28 LAB — BMP8+EGFR
BUN/Creatinine Ratio: 18 (ref 9–23)
BUN: 13 mg/dL (ref 6–20)
CO2: 22 mmol/L (ref 20–29)
Calcium: 8.9 mg/dL (ref 8.7–10.2)
Chloride: 102 mmol/L (ref 96–106)
Creatinine, Ser: 0.74 mg/dL (ref 0.57–1.00)
Glucose: 85 mg/dL (ref 70–99)
Potassium: 4.2 mmol/L (ref 3.5–5.2)
Sodium: 137 mmol/L (ref 134–144)
eGFR: 118 mL/min/{1.73_m2} (ref >59)

## 2022-02-28 LAB — CBC
Hematocrit: 40 % (ref 34.0–46.6)
Hemoglobin: 13.4 g/dL (ref 11.1–15.9)
MCH: 30.7 pg (ref 26.6–33.0)
MCHC: 33.5 g/dL (ref 31.5–35.7)
MCV: 92 fL (ref 79–97)
Platelets: 355 10*3/uL (ref 150–450)
RBC: 4.37 x10E6/uL (ref 3.77–5.28)
RDW: 12.8 % (ref 11.7–15.4)
WBC: 8 10*3/uL (ref 3.4–10.8)

## 2022-02-28 LAB — ROCKY MTN SPOTTED FVR AB, IGG-BLOOD: RMSF IgG: NEGATIVE

## 2022-02-28 LAB — LYME DISEASE SEROLOGY W/REFLEX: Lyme Total Antibody EIA: NEGATIVE

## 2022-02-28 LAB — TSH: TSH: 1.83 u[IU]/mL (ref 0.450–4.500)

## 2022-02-28 NOTE — Progress Notes (Signed)
All tests came back normal, no  indication that she has lyme disease or rocky mountain fever at this time. However she should complete the full course of antibiotics ordered to be on a safe side    She should follow up in the office after her CPE with OBGYN for routine labs .

## 2022-03-05 ENCOUNTER — Encounter: Payer: Self-pay | Admitting: Obstetrics and Gynecology

## 2022-03-05 ENCOUNTER — Ambulatory Visit: Payer: BC Managed Care – PPO | Admitting: Obstetrics and Gynecology

## 2022-03-05 ENCOUNTER — Other Ambulatory Visit (HOSPITAL_COMMUNITY)
Admission: RE | Admit: 2022-03-05 | Discharge: 2022-03-05 | Disposition: A | Discharge status: Home / Self Care | Payer: BC Managed Care – PPO | Source: Ambulatory Visit | Attending: Obstetrics and Gynecology | Admitting: Obstetrics and Gynecology

## 2022-03-05 VITALS — BP 111/80 | HR 85 | Ht 65.0 in | Wt 184.4 lb

## 2022-03-05 DIAGNOSIS — Z124 Encounter for screening for malignant neoplasm of cervix: Secondary | ICD-10-CM

## 2022-03-05 DIAGNOSIS — Z01419 Encounter for gynecological examination (general) (routine) without abnormal findings: Secondary | ICD-10-CM | POA: Insufficient documentation

## 2022-03-05 DIAGNOSIS — Z7689 Persons encountering health services in other specified circumstances: Secondary | ICD-10-CM

## 2022-03-05 NOTE — Progress Notes (Signed)
HPI:      Ms. Anadalay Macdonell is a 21 y.o. [id number] who LMP was No LMP recorded. (Menstrual status: IUD).  Subjective:   She presents today for her annual examination.  She has an IUD for birth control.  (Mirena) she does not have menstrual periods.  She believes she has been exposed to HSV-no longer with that partner.  She has never had an outbreak. She thinks that she might be anemic because she feels a lack of energy and when she eats "iron rich foods" she feels better.  She is specifically requesting blood count today.    Hx: The following portions of the patient's history were reviewed and updated as appropriate:             She  has a past medical history of Anxiety and Depression. She does not have any pertinent problems on file. She  has no past surgical history on file. Her family history includes Depression in her father; Stroke in her paternal grandmother. She  reports that she has never smoked. She has never used smokeless tobacco. She reports current alcohol use. She reports that she does not use drugs. She currently has no medications in their medication list. She has No Known Allergies.       Review of Systems:  Review of Systems  Constitutional: See HPI for additional information.  Eyes: Denied eye symptoms, eye pain, photophobia, vision change and visual disturbance.  Ears/Nose/Throat/Neck: Denied ear, nose, throat or neck symptoms, hearing loss, nasal discharge, sinus congestion and sore throat.  Cardiovascular: Denied cardiovascular symptoms, arrhythmia, chest pain/pressure, edema, exercise intolerance, orthopnea and palpitations.  Respiratory: Denied pulmonary symptoms, asthma, pleuritic pain, productive sputum, cough, dyspnea and wheezing.  Gastrointestinal: Denied, gastro-esophageal reflux, melena, nausea and vomiting.  Genitourinary: Denied genitourinary symptoms including symptomatic vaginal discharge, pelvic relaxation issues, and urinary complaints.  Musculoskeletal:  Denied musculoskeletal symptoms, stiffness, swelling, muscle weakness and myalgia.  Dermatologic: Denied dermatology symptoms, rash and scar.  Neurologic: Denied neurology symptoms, dizziness, headache, neck pain and syncope.  Psychiatric: Denied psychiatric symptoms, anxiety and depression.  Endocrine: Denied endocrine symptoms including hot flashes and night sweats.   Meds:   No current outpatient medications on file prior to visit.   No current facility-administered medications on file prior to visit.     Objective:     Vitals:   03/05/22 1055  BP: 111/80  Pulse: 85    Filed Weights   03/05/22 1055  Weight: 184 lb 6.4 oz (83.6 kg)              Physical examination General NAD, Conversant  HEENT Atraumatic; Op clear with mmm.  Normo-cephalic. Pupils reactive. Anicteric sclerae  Thyroid/Neck Smooth without nodularity or enlargement. Normal ROM.  Neck Supple.  Skin No rashes, lesions or ulceration. Normal palpated skin turgor. No nodularity.  Breasts: No masses or discharge.  Symmetric.  No axillary adenopathy.  Lungs: Clear to auscultation.No rales or wheezes. Normal Respiratory effort, no retractions.  Heart: NSR.  No murmurs or rubs appreciated. No periferal edema  Abdomen: Soft.  Non-tender.  No masses.  No HSM. No hernia  Extremities: Moves all appropriately.  Normal ROM for age. No lymphadenopathy.  Neuro: Oriented to PPT.  Normal mood. Normal affect.     Pelvic:   Vulva: Normal appearance.  No lesions.  Vagina: No lesions or abnormalities noted.  Support: Normal pelvic support.  Urethra No masses tenderness or scarring.  Meatus Normal size without lesions or prolapse.  Cervix: Normal  appearance.  No lesions.  IUD strings noted  Anus: Normal exam.  No lesions.  Perineum: Normal exam.  No lesions.        Bimanual   Uterus: Normal size.  Non-tender.  Mobile.  AV.  Adnexae: No masses.  Non-tender to palpation.  Cul-de-sac: Negative for abnormality.      Assessment:    [id number] Patient Active Problem List   Diagnosis Date Noted   Fatigue 02/26/2022   Tick bite of pelvic region 02/26/2022   Dizziness 02/26/2022   Anxiety and depression 02/26/2022     1. Establishing care with new doctor, encounter for   2. Well woman exam with routine gynecological exam   3. Cervical cancer screening        Plan:            1.  Basic Screening Recommendations The basic screening recommendations for asymptomatic women were discussed with the patient during her visit.  The age-appropriate recommendations were discussed with her and the rational for the tests reviewed.  When I am informed by the patient that another primary care physician has previously obtained the age-appropriate tests and they are up-to-date, only outstanding tests are ordered and referrals given as necessary.  Abnormal results of tests will be discussed with her when all of her results are completed.  Routine preventative health maintenance measures emphasized: Exercise/Diet/Weight control, Tobacco Warnings, Alcohol/Substance use risks and Stress Management  Pap performed-GC/CT  CBC Orders No orders of the defined types were placed in this encounter.   No orders of the defined types were placed in this encounter.         F/U  No follow-ups on file.  Elonda Husky, M.D. 03/05/2022 11:13 AM

## 2022-03-05 NOTE — Addendum Note (Signed)
Addended by: Georgiana Shore R on: 03/05/2022 11:30 AM   Modules accepted: Orders

## 2022-03-05 NOTE — Progress Notes (Signed)
Patients presents for annual exam today. She states happy with current IUD, no menstrual cycles.  She states concerns with her iron levels and that she may be anemic, reports she is often cold and fatigued. Patient reports sexual partner with herpes, would like to be checked today. Patient is due for pap smear, ordered. Patient states no other questions or concerns at this time.

## 2022-03-06 LAB — CBC
Hematocrit: 41.7 % (ref 34.0–46.6)
Hemoglobin: 14.3 g/dL (ref 11.1–15.9)
MCH: 31.4 pg (ref 26.6–33.0)
MCHC: 34.3 g/dL (ref 31.5–35.7)
MCV: 91 fL (ref 79–97)
Platelets: 331 10*3/uL (ref 150–450)
RBC: 4.56 x10E6/uL (ref 3.77–5.28)
RDW: 12.7 % (ref 11.7–15.4)
WBC: 7.7 10*3/uL (ref 3.4–10.8)

## 2022-03-11 LAB — CYTOLOGY - PAP
Chlamydia: NEGATIVE
Comment: NEGATIVE
Comment: NEGATIVE
Comment: NORMAL
High risk HPV: POSITIVE — AB
Neisseria Gonorrhea: NEGATIVE

## 2022-03-12 ENCOUNTER — Encounter: Payer: Self-pay | Admitting: Obstetrics and Gynecology

## 2022-04-02 ENCOUNTER — Encounter: Payer: Self-pay | Admitting: Obstetrics and Gynecology

## 2022-04-02 ENCOUNTER — Ambulatory Visit (INDEPENDENT_AMBULATORY_CARE_PROVIDER_SITE_OTHER): Payer: BC Managed Care – PPO | Admitting: Obstetrics and Gynecology

## 2022-04-02 ENCOUNTER — Other Ambulatory Visit (HOSPITAL_COMMUNITY)
Admission: RE | Admit: 2022-04-02 | Discharge: 2022-04-02 | Disposition: A | Discharge status: Home / Self Care | Payer: BC Managed Care – PPO | Source: Ambulatory Visit | Attending: Obstetrics and Gynecology | Admitting: Obstetrics and Gynecology

## 2022-04-02 VITALS — BP 114/78 | HR 71 | Ht 65.0 in | Wt 179.2 lb

## 2022-04-02 DIAGNOSIS — B977 Papillomavirus as the cause of diseases classified elsewhere: Secondary | ICD-10-CM

## 2022-04-02 DIAGNOSIS — R87612 Low grade squamous intraepithelial lesion on cytologic smear of cervix (LGSIL): Secondary | ICD-10-CM | POA: Insufficient documentation

## 2022-04-02 NOTE — Progress Notes (Signed)
   HPI:  Linda Duran is a 21 y.o.  [id number]  who presents today for evaluation and management of abnormal cervical cytology.    Dysplasia History:        LGSIL  positive HPV  first abnormal Pap  ROS:  Pertinent items noted in HPI and remainder of comprehensive ROS otherwise negative.  OB History  Gravida Para Term Preterm AB Living  3 1 1   2 1   SAB IAB Ectopic Multiple Live Births  2       1    # Outcome Date GA Lbr Len/2nd Weight Sex Delivery Anes PTL Lv  3 Term 05/2020     Vag-Spont   LIV  2 SAB 2021          1 SAB 2020            Past Medical History:  Diagnosis Date   Anxiety    Depression     History reviewed. No pertinent surgical history.  SOCIAL HISTORY:  Social History   Substance and Sexual Activity  Alcohol Use Yes   Comment: states she is trying to stop    Social History   Substance and Sexual Activity  Drug Use Never     Family History  Problem Relation Age of Onset   Depression Father    Stroke Paternal Grandmother     ALLERGIES:  Patient has no known allergies.  She has a current medication list which includes the following prescription(s): levonorgestrel.  Physical Exam: -Vitals:  BP 114/78   Pulse 71   Ht 5\' 5"  (1.651 m)   Wt 179 lb 3.2 oz (81.3 kg)   BMI 29.82 kg/m   PROCEDURE: Colposcopy performed with 4% acetic acid after verbal consent obtained                           -Aceto-white Lesions Location(s): See above              -Biopsy performed at 6 4 and 2 o'clock               -ECC indicated and performed: No.     -Biopsy sites made hemostatic with pressure and Monsel's solution   -Satisfactory colposcopy: Yes.      -Evidence of Invasive cervical CA :  NO  ASSESSMENT:  Linda Duran is a 21 y.o. [id number] here for  1. 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  PLAN: 1.  I discussed the grading system of pap smears and HPV high risk viral types.  We will discuss management after colpo results return.  No  orders of the defined types were placed in this encounter.          F/U  Return in about 2 weeks (around 04/16/2022). I spent 13 minutes involved in the care of this patient preparing to see the patient by obtaining and reviewing her medical history (including labs, imaging tests and prior procedures), documenting clinical information in the electronic health record (EHR), counseling and coordinating care plans, writing and sending prescriptions, ordering tests or procedures and in direct communicating with the patient and medical staff discussing pertinent items from her history and physical exam.  Jeannie Fend ,MD 04/02/2022,8:45 AM

## 2022-04-02 NOTE — Progress Notes (Signed)
Patient presents today for a colposcopy. Her recent pap smear results showed LGSIL/HPV+. Patient states no additional questions at this time.

## 2022-04-03 ENCOUNTER — Encounter: Payer: Self-pay | Admitting: Obstetrics and Gynecology

## 2022-04-14 LAB — SURGICAL PATHOLOGY

## 2022-04-15 ENCOUNTER — Telehealth (INDEPENDENT_AMBULATORY_CARE_PROVIDER_SITE_OTHER): Payer: BC Managed Care – PPO | Admitting: Obstetrics and Gynecology

## 2022-04-15 ENCOUNTER — Encounter: Payer: Self-pay | Admitting: Obstetrics and Gynecology

## 2022-04-15 DIAGNOSIS — N871 Moderate cervical dysplasia: Secondary | ICD-10-CM

## 2022-04-15 NOTE — Progress Notes (Signed)
Virtual Visit via Video Note  I connected with Linda Duran on 04/15/22 at  7:45 AM EDT by video and verified that I was speaking with the correct person using two identifiers.    [name] is a 21 y.o. [id number] who LMP was No LMP recorded. (Menstrual status: IUD). I discussed the limitations, risks, security and privacy concerns of performing an evaluation and management service by video and the availability of in person appointments. I also discussed with the patient that there may be a patient responsible charge related to this service. The patient expressed understanding and agreed to proceed.  Location of patient: Home  Patient gave explicit verbal consent for video visit:  YES  Location of provider:  [facility] office  Persons other than physician and patient involved in provider conference:  None   Subjective:   History of Present Illness:    History of abnormal Pap smear low-grade with positive HPV.  Patient underwent colposcopy and directed biopsies.  We have met today to discuss her results and the plan management for the future.  Hx: The following portions of the patient's history were reviewed and updated as appropriate:             She  has a past medical history of Anxiety and Depression. She does not have any pertinent problems on file. She  has no past surgical history on file. Her family history includes Depression in her father; Stroke in her paternal grandmother. She  reports that she has never smoked. She has never used smokeless tobacco. She reports current alcohol use. She reports that she does not use drugs. She has a current medication list which includes the following prescription(s): levonorgestrel. She has No Known Allergies.       Review of Systems:  Review of Systems  Constitutional: Denied constitutional symptoms, night sweats, recent illness, fatigue, fever, insomnia and weight loss.  Eyes: Denied eye symptoms, eye pain, photophobia, vision change and  visual disturbance.  Ears/Nose/Throat/Neck: Denied ear, nose, throat or neck symptoms, hearing loss, nasal discharge, sinus congestion and sore throat.  Cardiovascular: Denied cardiovascular symptoms, arrhythmia, chest pain/pressure, edema, exercise intolerance, orthopnea and palpitations.  Respiratory: Denied pulmonary symptoms, asthma, pleuritic pain, productive sputum, cough, dyspnea and wheezing.  Gastrointestinal: Denied, gastro-esophageal reflux, melena, nausea and vomiting.  Genitourinary: Denied genitourinary symptoms including symptomatic vaginal discharge, pelvic relaxation issues, and urinary complaints.  Musculoskeletal: Denied musculoskeletal symptoms, stiffness, swelling, muscle weakness and myalgia.  Dermatologic: Denied dermatology symptoms, rash and scar.  Neurologic: Denied neurology symptoms, dizziness, headache, neck pain and syncope.  Psychiatric: Denied psychiatric symptoms, anxiety and depression.  Endocrine: Denied endocrine symptoms including hot flashes and night sweats.   Meds:   Current Outpatient Medications on File Prior to Visit  Medication Sig Dispense Refill   levonorgestrel (MIRENA) 20 MCG/DAY IUD 1 each by Intrauterine route once.     No current facility-administered medications on file prior to visit.    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  We have discussed CIN-2.  The natural course and history of CIN was discussed and its relationship to HPV was reviewed.  Because she is young there is  somewhat of an expectation that she may be able to resolve this without treatment.  Per ASCCP guidelines we will repeat her colposcopy in 6 months with associated cytology. I have answered all her questions and I think she  has a good understanding of the necessity of close follow-up.  Orders No orders of the defined types were placed in this encounter.   No orders of the defined types were placed in this encounter.     F/U  Return in about 6 months (around 10/15/2022). I spent 18 minutes involved in the care of this patient preparing to see the patient by obtaining and reviewing her medical history (including labs, imaging tests and prior procedures), documenting clinical information in the electronic health record (EHR), counseling and coordinating care plans, writing and sending prescriptions, ordering tests or procedures and in direct communicating with the patient and medical staff discussing pertinent items from her history and physical exam.  Finis Bud, M.D. 04/15/2022 7:52 AM

## 2022-06-16 IMAGING — US US PELVIS COMPLETE TRANSABD/TRANSVAG W DUPLEX
2 series · 13 of 25 positions shown · non-contrast
Comparison: None.

CLINICAL DATA: Initial evaluation for acute lower abdominal pain.

EXAM:
TRANSABDOMINAL AND TRANSVAGINAL ULTRASOUND OF PELVIS
DOPPLER ULTRASOUND OF OVARIES
TECHNIQUE: Both transabdominal and transvaginal ultrasound examinations of the
pelvis were performed. Transabdominal technique was performed for
global imaging of the pelvis including uterus, ovaries, adnexal
regions, and pelvic cul-de-sac.
It was necessary to proceed with endovaginal exam following the
transabdominal exam to visualize the uterus, endometrium, and
ovaries. Color and duplex Doppler ultrasound was utilized to
evaluate blood flow to the ovaries.

[Series 1: us pelvic complete w transvaginal and torsion righ · 105 acquisitions, 12 frames shown]
[im 1/105]
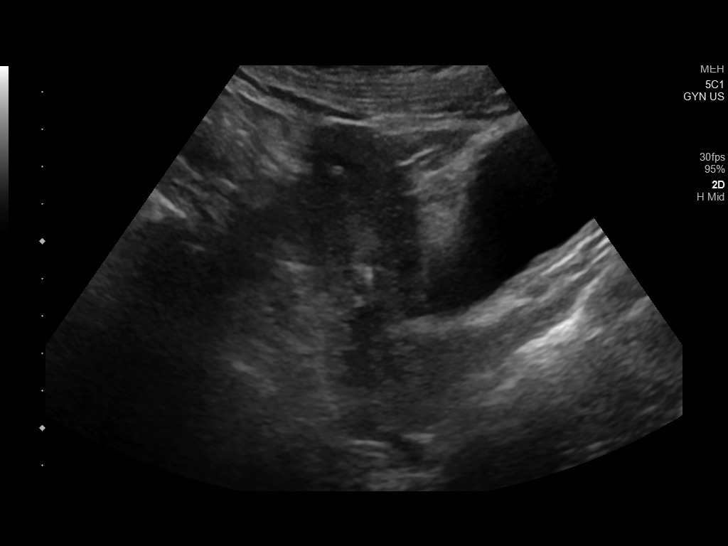
[im 10/105]
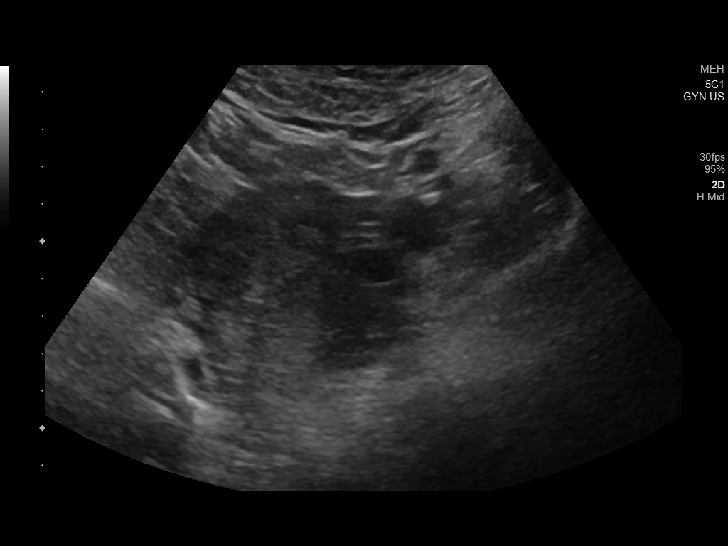
[im 19/105]
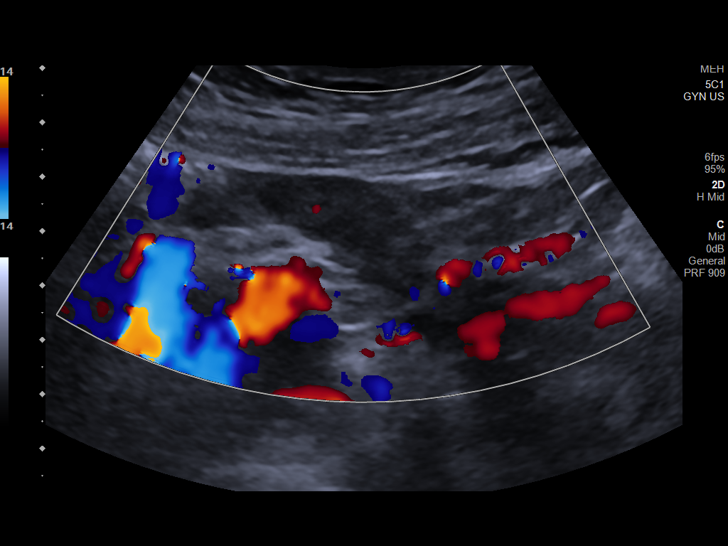
[im 28/105]
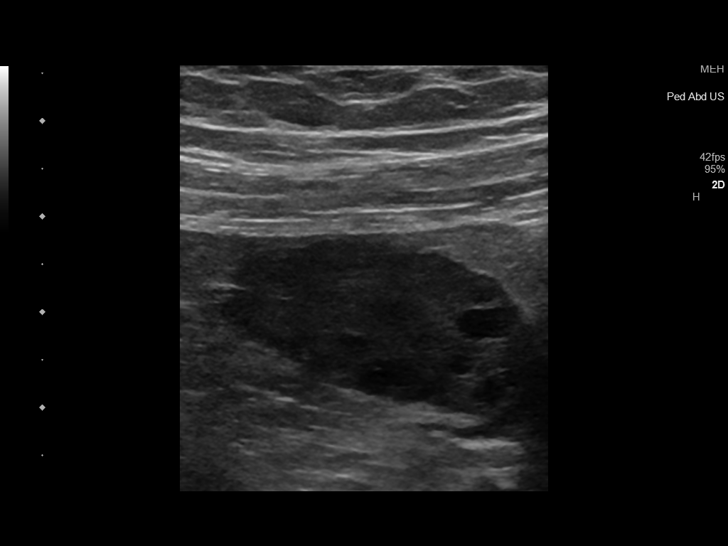
[im 37/105]
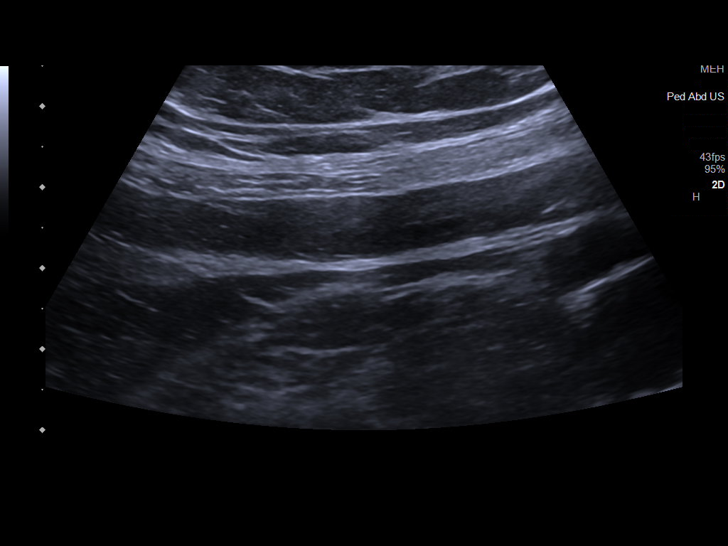
[im 46/105]
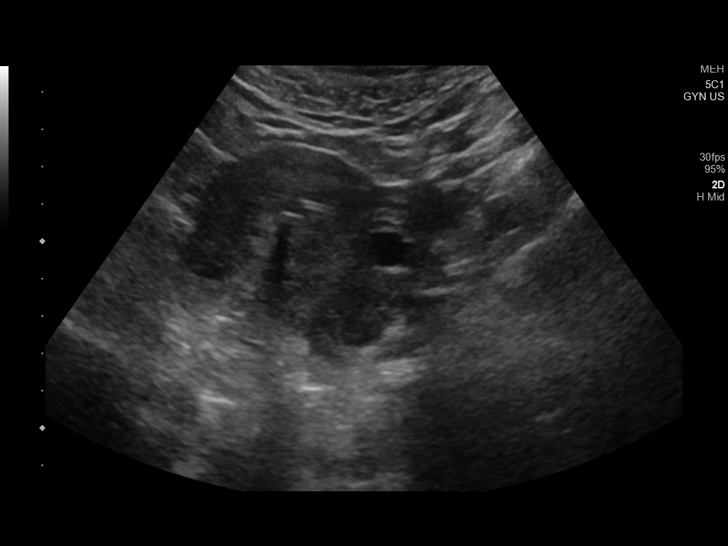
[im 55/105]
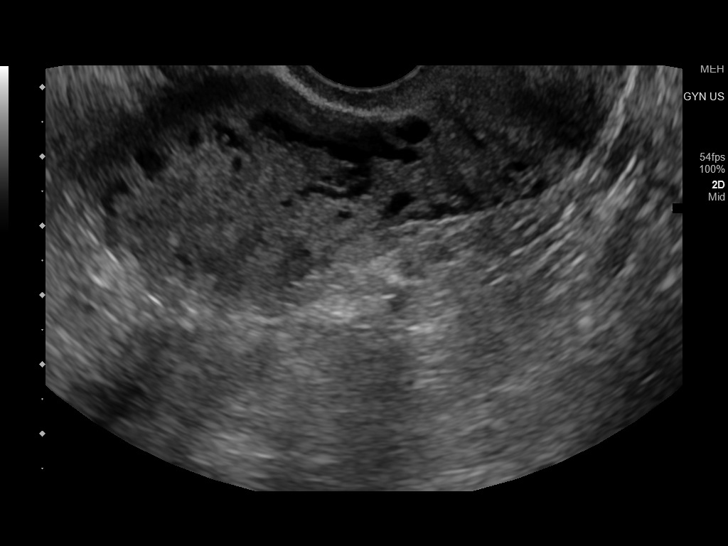
[im 64/105]
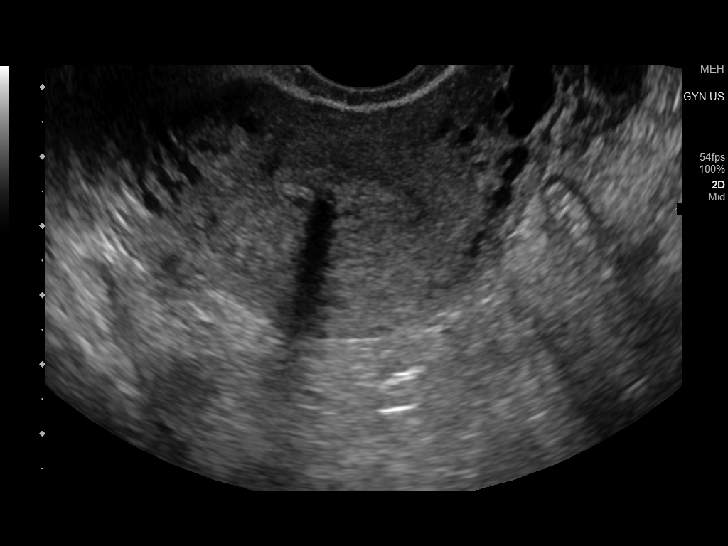
[im 73/105]
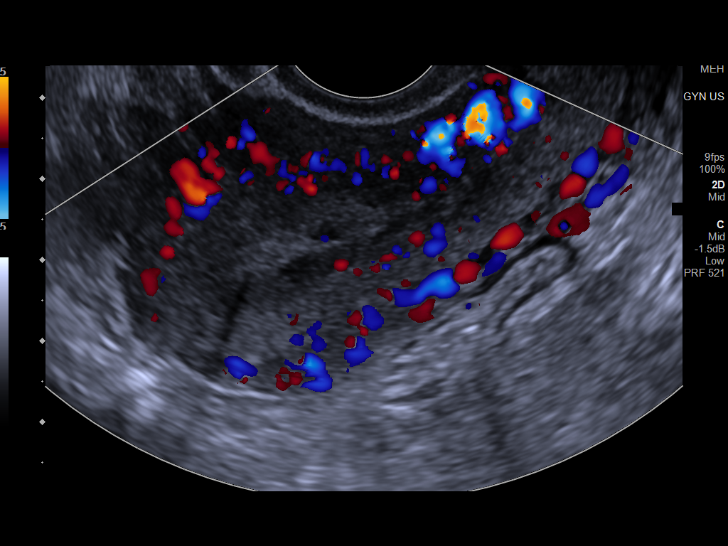
[im 82/105]
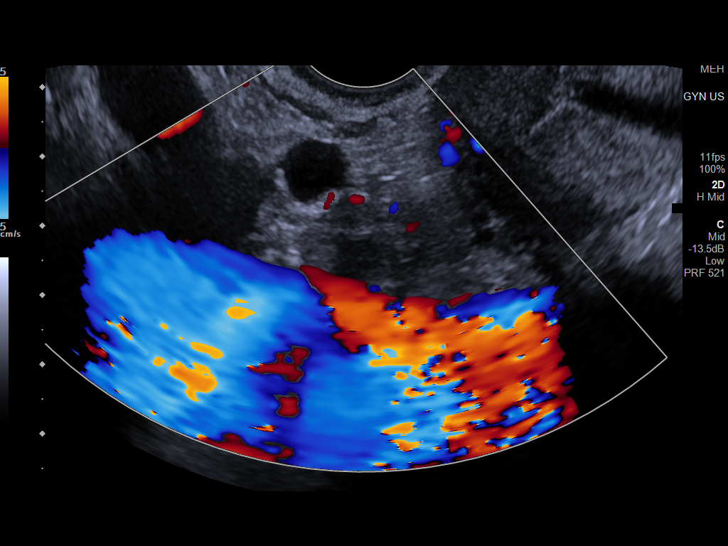
[im 91/105]
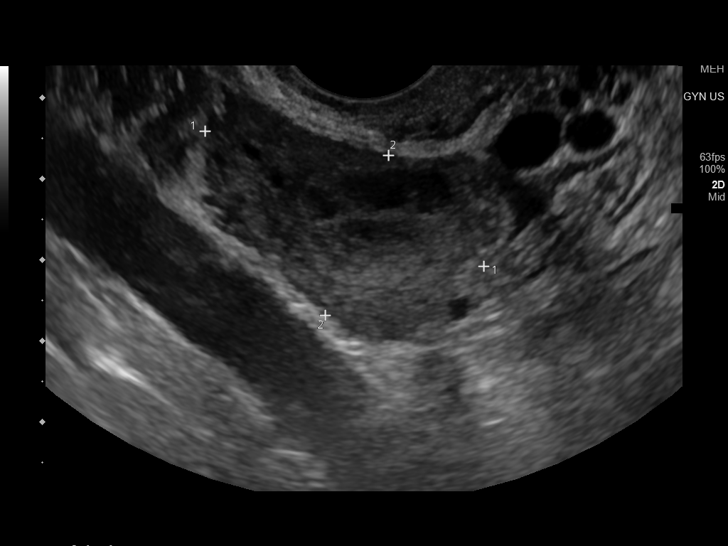
[im 100/105]
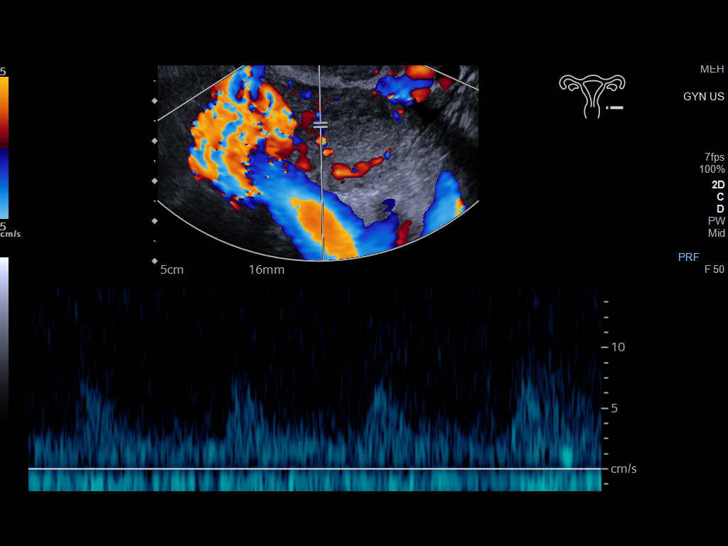

[Series 1001: gyn us · 1 of 1 slices shown]
[im 1/1]
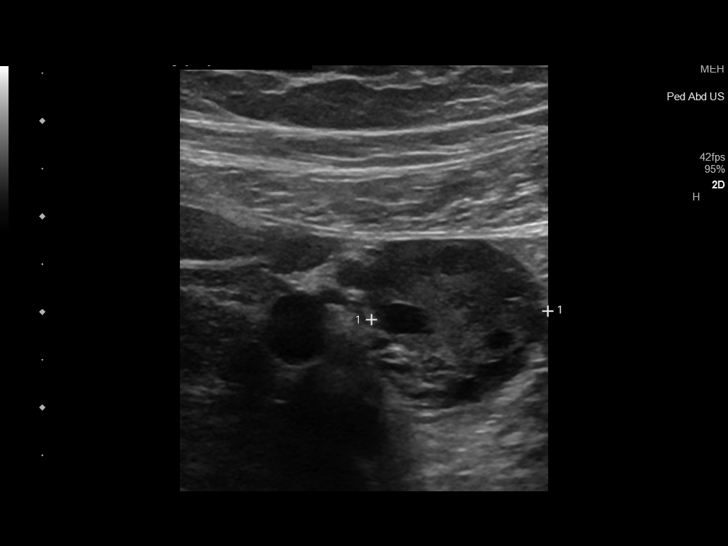

[13 of 25 positions shown; findings below may reference images not displayed]

FINDINGS: Uterus

Measurements: 8.2 x 4.0 x 4.9 cm = volume: 83.0 mL. Uterus is
anteverted. No discrete fibroid or other mass.

Endometrium

Thickness: 5.2 mm. No focal abnormality visualized. IUD in
appropriate position within the endometrial cavity.

Right ovary

Measurements: 3.5 x 1.4 x 1.8 cm = volume: 4.8 mL. 1.5 cm simple
cyst seen adjacent to the right ovary, likely an exophytic cyst
versus paraovarian cyst. No internal complexity, vascularity, or
solid component.

Left ovary

Measurements: 3.8 x 2.1 x 2.4 cm = volume: 10.1 mL. 1.7 x 2.0 x
cm hypoechoic lesion with peripherally increased vascularity, most
consistent with a degenerating corpus luteal cyst. No other adnexal
mass.

Pulsed Doppler evaluation of both ovaries demonstrates normal
low-resistance arterial and venous waveforms.

Other findings

No abnormal free fluid.
IMPRESSION: 1. 2 cm degenerating left ovarian corpus luteal cyst. Finding could
contribute to lower abdominal pain.
2. 1.5 cm simple right adnexal cyst, likely an exophytic follicular
cyst or possibly paraovarian cyst. This has benign features, with no
followup imaging recommended. Note: This recommendation does not
apply to premenarchal patients or to those with increased risk
(genetic, family history, elevated tumor markers or other high-risk
factors) of ovarian cancer. Reference: Radiology [DATE]. IUD in appropriate position within the endometrial cavity.
4. No evidence for ovarian torsion.

## 2022-10-20 ENCOUNTER — Ambulatory Visit: Payer: BC Managed Care – PPO

## 2022-10-20 ENCOUNTER — Other Ambulatory Visit (HOSPITAL_COMMUNITY)
Admission: RE | Admit: 2022-10-20 | Discharge: 2022-10-20 | Disposition: A | Discharge status: Home / Self Care | Payer: BC Managed Care – PPO | Source: Ambulatory Visit | Attending: Obstetrics and Gynecology | Admitting: Obstetrics and Gynecology

## 2022-10-20 ENCOUNTER — Encounter: Payer: Self-pay | Admitting: Obstetrics and Gynecology

## 2022-10-20 ENCOUNTER — Ambulatory Visit (INDEPENDENT_AMBULATORY_CARE_PROVIDER_SITE_OTHER): Payer: BC Managed Care – PPO | Admitting: Obstetrics and Gynecology

## 2022-10-20 VITALS — BP 101/72 | HR 87 | Ht 65.0 in | Wt 190.6 lb

## 2022-10-20 DIAGNOSIS — N870 Mild cervical dysplasia: Secondary | ICD-10-CM

## 2022-10-20 DIAGNOSIS — N871 Moderate cervical dysplasia: Secondary | ICD-10-CM

## 2022-10-20 NOTE — Progress Notes (Signed)
   HPI:  Linda Duran is a 22 y.o.  [id number]  who presents today for evaluation and management of abnormal cervical cytology.    Dysplasia History: CIN-2 by previous colposcopy 6 months ago  HPV: Positive  ROS:  Pertinent items noted in HPI and remainder of comprehensive ROS otherwise negative.  OB History  Gravida Para Term Preterm AB Living  3 1 1   2 1   SAB IAB Ectopic Multiple Live Births  2       1    # Outcome Date GA Lbr Len/2nd Weight Sex Delivery Anes PTL Lv  3 Term 05/2020     Vag-Spont   LIV  2 SAB 2021          1 SAB 2020            Past Medical History:  Diagnosis Date   Anxiety    Depression     History reviewed. No pertinent surgical history.  SOCIAL HISTORY:  Social History   Substance and Sexual Activity  Alcohol Use Yes   Comment: states she is trying to stop    Social History   Substance and Sexual Activity  Drug Use Never     Family History  Problem Relation Age of Onset   Depression Father    Stroke Paternal Grandmother     ALLERGIES:  Patient has no known allergies.  She has a current medication list which includes the following prescription(s): levonorgestrel.  Physical Exam: -Vitals:  BP 101/72   Pulse 87   Ht 5\' 5"  (1.651 m)   Wt 190 lb 9.6 oz (86.5 kg)   BMI 31.72 kg/m   PROCEDURE: Colposcopy performed with 4% acetic acid after verbal consent obtained                           -Aceto-white Lesions Location(s): See above              -Biopsy performed at 10 and 12 o'clock               -ECC indicated and performed: No.     -Biopsy sites made hemostatic with pressure and Monsel's solution   -Satisfactory colposcopy: Yes.      -Evidence of Invasive cervical CA :  NO  ASSESSMENT:  Linda Duran is a 22 y.o. [id number] here for  1. CIN II (cervical intraepithelial neoplasia II)   .  PLAN: 1.  I discussed the grading system of pap smears and HPV high risk viral types.  We will discuss management after colpo results  return.  No orders of the defined types were placed in this encounter.          F/U  Return in about 2 weeks (around 11/03/2022) for Colpo f/u.  Brennan Bailey ,MD 10/20/2022,3:06 PM

## 2022-10-20 NOTE — Progress Notes (Signed)
Patient presents today for 6 month follow-up colposcopy. She states no additional questions or concerns.

## 2022-10-22 ENCOUNTER — Encounter: Payer: Self-pay | Admitting: Obstetrics and Gynecology

## 2022-10-22 LAB — SURGICAL PATHOLOGY

## 2022-11-03 ENCOUNTER — Telehealth (INDEPENDENT_AMBULATORY_CARE_PROVIDER_SITE_OTHER): Payer: BC Managed Care – PPO | Admitting: Obstetrics and Gynecology

## 2022-11-03 ENCOUNTER — Encounter: Payer: Self-pay | Admitting: Obstetrics and Gynecology

## 2022-11-03 DIAGNOSIS — N870 Mild cervical dysplasia: Secondary | ICD-10-CM

## 2022-11-03 NOTE — Progress Notes (Signed)
Virtual Visit via Video Note  I connected with Linda Duran on 11/03/22 at  7:55 AM EDT by video and verified that I was speaking with the correct person using two identifiers.    [name] is a 22 y.o. [id number] who LMP was No LMP recorded. (Menstrual status: IUD). I discussed the limitations, risks, security and privacy concerns of performing an evaluation and management service by video and the availability of in person appointments. I also discussed with the patient that there may be a patient responsible charge related to this service. The patient expressed understanding and agreed to proceed.  Location of patient: Home  Patient gave explicit verbal consent for video visit:  YES  Location of provider:  [facility] office  Persons other than physician and patient involved in provider conference:  None   Subjective:   History of Present Illness:    Patient had recent history of CIN-2.  This is a follow-up colposcopy.  All cervical change appeared to be external at colposcopy and 2 biopsies were performed.  Hx: The following portions of the patient's history were reviewed and updated as appropriate:             She  has a past medical history of Anxiety and Depression. She does not have any pertinent problems on file. She  has no past surgical history on file. Her family history includes Depression in her father; Stroke in her paternal grandmother. She  reports that she has never smoked. She has never used smokeless tobacco. She reports current alcohol use. She reports that she does not use drugs. She has a current medication list which includes the following prescription(s): levonorgestrel. She has No Known Allergies.       Review of Systems:  Review of Systems  Constitutional: Denied constitutional symptoms, night sweats, recent illness, fatigue, fever, insomnia and weight loss.  Eyes: Denied eye symptoms, eye pain, photophobia, vision change and visual disturbance.   Ears/Nose/Throat/Neck: Denied ear, nose, throat or neck symptoms, hearing loss, nasal discharge, sinus congestion and sore throat.  Cardiovascular: Denied cardiovascular symptoms, arrhythmia, chest pain/pressure, edema, exercise intolerance, orthopnea and palpitations.  Respiratory: Denied pulmonary symptoms, asthma, pleuritic pain, productive sputum, cough, dyspnea and wheezing.  Gastrointestinal: Denied, gastro-esophageal reflux, melena, nausea and vomiting.  Genitourinary: Denied genitourinary symptoms including symptomatic vaginal discharge, pelvic relaxation issues, and urinary complaints.  Musculoskeletal: Denied musculoskeletal symptoms, stiffness, swelling, muscle weakness and myalgia.  Dermatologic: Denied dermatology symptoms, rash and scar.  Neurologic: Denied neurology symptoms, dizziness, headache, neck pain and syncope.  Psychiatric: Denied psychiatric symptoms, anxiety and depression.  Endocrine: Denied endocrine symptoms including hot flashes and night sweats.   Meds:   Current Outpatient Medications on File Prior to Visit  Medication Sig Dispense Refill   levonorgestrel (MIRENA) 20 MCG/DAY IUD 1 each by Intrauterine route once.     No current facility-administered medications on file prior to visit.    Assessment:    [id number] Patient Active Problem List   Diagnosis Date Noted   Fatigue 02/26/2022   Tick bite of pelvic region 02/26/2022   Dizziness 02/26/2022   Anxiety and depression 02/26/2022     1. CIN I (cervical intraepithelial neoplasia I)     Improvement over CIN-2 in 6 months-now down to CIN-1.  Plan:            1.  Per ASCCP guidelines we will plan for Pap smear in 1 year and based on the Pap consider colposcopy.  Discussed in detail with the  patient.  All questions answered.  Natural course and history of HPV reviewed again. Orders No orders of the defined types were placed in this encounter.   No orders of the defined types were placed in this  encounter.     F/U  No follow-ups on file. I spent 21 minutes involved in the care of this patient preparing to see the patient by obtaining and reviewing her medical history (including labs, imaging tests and prior procedures), documenting clinical information in the electronic health record (EHR), counseling and coordinating care plans, writing and sending prescriptions, ordering tests or procedures and in direct communicating with the patient and medical staff discussing pertinent items from her history and physical exam.   Linda Duran, M.D. 11/03/2022 7:49 AM

## 2022-11-12 ENCOUNTER — Ambulatory Visit (HOSPITAL_COMMUNITY)
Admission: EM | Admit: 2022-11-12 | Discharge: 2022-11-12 | Disposition: A | Discharge status: Home / Self Care | Payer: BC Managed Care – PPO

## 2022-11-12 DIAGNOSIS — F32A Depression, unspecified: Secondary | ICD-10-CM | POA: Diagnosis not present

## 2022-11-12 DIAGNOSIS — F419 Anxiety disorder, unspecified: Secondary | ICD-10-CM | POA: Diagnosis not present

## 2022-11-12 NOTE — Discharge Instructions (Signed)
It is imperative that you follow through with treatment recommendations within 5-7 days from the day of discharge to mitigate further risk to your safety and overall mental well-being.  A list of outpatient therapy and psychiatric providers for medication management has been provided below to get you started in finding the right provider for you.            [place]  [facility] [address] [place], [place], [address] [phone] phone (Medicare, Private insurance except Tricare, Blue Local, and Blue Home)  [facility] [address] [place], [place], [address] [phone] phone (Aetna, AmeriHealth [place], BCBS, Cigna, Evernorth, Friday Health Plans, Gateway Health, BCBS Healthy Blue, Humana, Magellan Health, Medcost, Medicare, Medicaid, Optum, Tricare, UHC, UHC Community Plan, Wellcare)  [facility] [address] [place], [place], [address] [phone] phone [phone] phone [phone] fax  [facility] [address] [place], [place], [address] [phone] phone (Call to confirm insurance coverage) Consultation & Support Services     o Drop-In Hours: 1:00 PM to 5:00 PM     o Days: Monday - Thursday  Crisis Services (24/7)   [facility] [address] [place], [place], [address] [phone] phone (Healthy Blue [place], UHC, [place] Medicaid, Vaya and Partners, Humana)      [facility] [address] [place], [place], [address] [phone] phone [email or web address]  (to complete the intake form and upload ID and insurance cards)  [facility] [address] [place], [place], [address] [phone] phone (Aetna, Alliance, BCBS, [place] Complete Health, Cigna, Medicare, Optum, UHC, Wellcare, and certain Medicaid plans)  [facility] [address] [place], [place], [address] [phone] phone [phone] fax (Medicaid, Medicare, Self-pay, call about other insurance coverage)  [facility] (age 13+) [address] [place], [place], [address] [phone] hone [phone] fax (Cigna, MedCost, BCBS, Aetna, Magellan, First Health, Healthgram, Humana, Tricare, Multiplan, certain Medicare providers, UHC, UMR)  [facility] [address] [place], [place], [address] [phone] phone (Medicare, Medicaid, Aetna, Cigna, call about other insurance coverage)  [facility] [address] [place], [place], [address] [phone] phone [phone] fax (Call [phone] to see what insurance is accepted) (Gerald Plovsky, MD specializes in geropsych)  [facility]  (medication management only) [address] [place], [place], [address] [phone] phone [phone] fax (Medicare, Medicaid, BCBS, Tricare, Humana, MedCost, Cigna, UHC, UMR)  [facility] (medication management only) [address] [place], [place], [address] [phone] phone [phone] fax (Cigna, Medicare, BCBS, Aetna, Tricare East)  [facility] [address] [place], [place], [address] [phone] phone [phone] fax (Aetna, BCBS, Cigna, Magellan, MedCost, UHC, [place] Medicaid/[place] Health Choice)  [facility] [address] [place], [place], [address] [phone] phone [phone] fax (Medicare, Medicaid, BCBS)  [facility] [address] [place], [place] [address] [phone] phone (Aetna, BCBS, Cigna, CBHA, MedCost, Medicare, Magellan, UHC) Does genetic testing for medications; does transcranial magnetic stimulation along with basic services)  [facility] [address] [place], [place],  [address] [phone] phone (Call about insurance coverage)  [facility] [address] [place], [place], [address] [phone] phone [phone] fax (Call about insurance coverage)  [facility] [address] [place], , [address] [phone] phone [phone]   fax (Call about insurance coverage)  [facility] [address] [place], [place], [address] [phone] phone (Medicaid, Tricare, BCBS, Aetna, Humana)  [facility] [address] [place], [place], [address] [phone] phone (Medicaid, Medicare, call about other insurance coverage)  [facility] [address] [place], [place], [address] [phone] phone [phone] fax (Medicaid, Medicare, Tricare, call about other insurance coverage)  [facility] [address] [place], [place], [address] [phone] phone (Aetna, BCBS, Cigna, MedCost, Tricare, Medicaid types - Alliance, AmeriHealth, Partners, Vaya, [place] Health Choice, Healthy Blue, [place], Wellcare, and Complete)  [facility] [address] , [place], [address] [phone] phone Completely online treatment platform Contact: Jonay Argier - Behavioral Health Outreach Specialist [phone] phone [phone] fax (Aetna, BCBS, Cigna, Friday Health Plan, Humana, Magellan, MedCost, Medicaid, Medicare, [facility]BH Optum)                                               

## 2022-11-12 NOTE — ED Provider Notes (Signed)
Behavioral Health Urgent Care Medical Screening Exam  Patient Name: Linda Duran MRN: [id number] Date of Evaluation: 11/12/22 Chief Complaint:  voluntarily at the recommendation of her therapist, with increased depression and anxiety. Diagnosis:  Final diagnoses:  Anxiety and depression    History of Present illness: Linda Duran is a 22 y.o. female Linda Duran 22 y.o., female patient admitted to [facility] voluntarily at the recommendation of her therapist, with increased depression and anxiety.  Patient seen face to face by this provider and chart reviewed on 11/12/22.  Patient reports being diagnosed with depression, anxiety and PTSD.  She has therapy in place every other week.  She currently does not take any medications.  However she reports a past psychiatric history of medication trials that include SSRIs and SNRIs.  Patient states she cannot take either 1 of these medications due to adverse reactions.  She lives in the home with her 27-year-old child.  She is employed full-time as a Building surveyor.  She denies any substance use.  During evaluation Graylee Arutyunyan is observed sitting in the assessment room in no acute distress.  She is alert/oriented x 4, cooperative, and attentive.  Her speech is clear, coherent, at a normal rate and tone.  She endorses an increase in her depression and anxiety x 1 week.  She identifies no specific stressor/trigger other than her job.  States taking care of her own 52-year-old and then a classroom full of 21-year-old's is very overwhelming at times.  She endorses symptoms that include low energy, low motivation, decreased appetite, decreased sleep, and decreased energy.  She has a depressed affect.  She adamantly denies SI/HI/AVH.  She verbally contracts for safety.  She denies access to firearms/weapons.  Objectively:  there is no evidence of psychosis/mania or delusional thinking.She  conversed coherently, with goal directed thoughts, and no.   Patient is reluctant to  start medications for depression at this time due to her past medication trials and adverse reactions.  Discussed partial hospitalization program with patient (PHP).  She declined due to her work schedule.  She was provided with a list of outpatient psychiatric resources for medication management and therapy.  At this time [name] is educated and verbalizes understanding of mental health resources and other crisis services in the community.  She is instructed to call 911 and present to the nearest emergency room should she experience any suicidal/homicidal ideation, auditory/visual/hallucinations, or detrimental worsening of she mental health condition.   She was a also advised by Clinical research associate that she could call the toll-free phone on back of  insurance card to assist with identifying in network services and agencies or the number on back of Medicaid card to speak with care coordinator  Flowsheet Row ED from 02/23/2022 in [facility] ED from 06/18/2021 in [facility] at [facility] ED from 12/17/2020 in [facility] Emergency Department at [facility]  C-SSRS RISK CATEGORY Error: Question 6 not populated No Risk No Risk       Psychiatric Specialty Exam  Presentation  General Appearance:Appropriate for Environment; Fairly Groomed  Eye Contact:Good  Speech:Clear and Coherent; Normal Rate  Speech Volume:Normal  Handedness:Right   Mood and Affect  Mood:Anxious; Depressed  Affect:Congruent   Thought Process  Thought Processes:Coherent  Descriptions of Associations:Intact  Orientation:No data recorded Thought Content:Logical    Hallucinations:None  Ideas of Reference:None  Suicidal Thoughts:No  Homicidal Thoughts:No   Sensorium  Memory:Immediate Good; Recent Good; Remote Good  Judgment:Good  Insight:Good  Executive Functions  Concentration:Good  Attention Span:Good  Recall:Good  Progress Energy of  Knowledge:Good  Language:Good   Psychomotor Activity  Psychomotor Activity:Normal   Assets  Assets:Communication Skills; Desire for Improvement; Financial Resources/Insurance; Physical Health; Resilience; Social Support; Housing; Leisure Time; Vocational/Educational; Transportation   Sleep  Sleep:Fair  Number of hours: 6   Physical Exam: Physical Exam Vitals and nursing note reviewed.  Constitutional:      General: She is not in acute distress.    Appearance: Normal appearance. She is not ill-appearing.  HENT:     Head: Normocephalic.  Eyes:     General:        Right eye: No discharge.        Left eye: No discharge.     Conjunctiva/sclera: Conjunctivae normal.  Cardiovascular:     Rate and Rhythm: Normal rate.  Pulmonary:     Effort: Pulmonary effort is normal.  Musculoskeletal:        General: Normal range of motion.     Cervical back: Normal range of motion.  Skin:    Coloration: Skin is not jaundiced or pale.  Neurological:     Mental Status: She is alert and oriented to person, place, and time.  Psychiatric:        Attention and Perception: Attention and perception normal.        Mood and Affect: Affect normal. Mood is anxious and depressed.        Speech: Speech normal.        Behavior: Behavior normal. Behavior is cooperative.        Thought Content: Thought content normal.        Cognition and Memory: Cognition normal.        Judgment: Judgment normal.    Review of Systems  Constitutional: Negative.   HENT: Negative.    Eyes: Negative.   Respiratory: Negative.    Cardiovascular: Negative.   Musculoskeletal: Negative.   Skin: Negative.   Neurological: Negative.   Psychiatric/Behavioral:  Positive for depression. The patient is nervous/anxious.    Blood pressure 119/83, pulse 94, temperature (!) 97.5 F (36.4 C), temperature source Oral, resp. rate 18, SpO2 98 %. There is no height or weight on file to calculate BMI.  Musculoskeletal: Strength  & Muscle Tone: within normal limits Gait & Station: normal Patient leans: N/A   [facility] MSE Discharge Disposition for Follow up and Recommendations: Based on my evaluation the patient does not appear to have an emergency medical condition and can be discharged with resources and follow up care in outpatient services for Medication Management, Partial Hospitalization Program, and Individual Therapy  Discharge patient  Provided outpatient psychiatric resources for medication management and therapy   Ardis Hughs, NP 11/12/2022, 11:32 AM

## 2022-11-12 NOTE — Progress Notes (Signed)
   11/12/22 1055  [facility] Triage Screening (Walk-ins at [facility] only)  How Did You Hear About [place]? Self  What Is the Reason for Your Visit/Call Today? Pt is a 22 yo female who presents voluntarily to [facility] due to worsening depressive symptons. Pt reports that she is currently having difficulty sleeping and eating due to her depression. Pt reports that she currently has therapist and was advised to come to [facility] address her depression. Pt reports that she is currently dealing with a lot of stress from work. 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 Pt denies SI, HI, and AVH. Pt is currently not on any medications but is open to starting medications for depression.  How Long Has This Been Causing You Problems? 1 wk - 1 month  Have You Recently Had Any Thoughts About Hurting Yourself? No  Are You Planning to Commit Suicide/Harm Yourself At This time? No  Have you Recently Had Thoughts About Hurting Someone Karolee Ohs? No  Are You Planning To Harm Someone At This Time? No  Are you currently experiencing any auditory, visual or other hallucinations? No  Have You Used Any Alcohol or Drugs in the Past 24 Hours? No  Do you have any current medical co-morbidities that require immediate attention? No  Clinician description of patient physical appearance/behavior: Pt was casually dressed and adequately groomed. Pt was polite and cooperative. Pt's speech, movement and thought content were within normal limits. Pt's mood was somewhat sad and a bit anxious but with a flat affect. Pt was oriented x 4.  What Do You Feel Would Help You the Most Today? Stress Management;Medication(s);Treatment for Depression or other mood problem  If access to [facility] Urgent Care was not available, would you have sought care in the Emergency Department? Yes  Determination of Need Routine (7 days)  Options For Referral Outpatient Therapy    Flowsheet Row ED from 11/12/2022 in [facility] ED from 02/23/2022 in  [facility] Care at [facility] ED from 06/18/2021 in [facility] at [facility] RISK CATEGORY No Risk Error: Question 6 not populated No Risk

## 2023-01-11 DIAGNOSIS — N390 Urinary tract infection, site not specified: Secondary | ICD-10-CM | POA: Diagnosis not present

## 2023-01-11 DIAGNOSIS — R350 Frequency of micturition: Secondary | ICD-10-CM | POA: Diagnosis not present

## 2023-01-26 DIAGNOSIS — F411 Generalized anxiety disorder: Secondary | ICD-10-CM | POA: Diagnosis not present

## 2023-01-26 DIAGNOSIS — F331 Major depressive disorder, recurrent, moderate: Secondary | ICD-10-CM | POA: Diagnosis not present

## 2023-01-26 DIAGNOSIS — F4312 Post-traumatic stress disorder, chronic: Secondary | ICD-10-CM | POA: Diagnosis not present

## 2023-01-26 DIAGNOSIS — F1010 Alcohol abuse, uncomplicated: Secondary | ICD-10-CM | POA: Diagnosis not present

## 2023-02-08 DIAGNOSIS — F1010 Alcohol abuse, uncomplicated: Secondary | ICD-10-CM | POA: Diagnosis not present

## 2023-02-08 DIAGNOSIS — F331 Major depressive disorder, recurrent, moderate: Secondary | ICD-10-CM | POA: Diagnosis not present

## 2023-02-08 DIAGNOSIS — F411 Generalized anxiety disorder: Secondary | ICD-10-CM | POA: Diagnosis not present

## 2023-02-16 DIAGNOSIS — F4312 Post-traumatic stress disorder, chronic: Secondary | ICD-10-CM | POA: Diagnosis not present

## 2023-02-16 DIAGNOSIS — F331 Major depressive disorder, recurrent, moderate: Secondary | ICD-10-CM | POA: Diagnosis not present

## 2023-02-16 DIAGNOSIS — F1010 Alcohol abuse, uncomplicated: Secondary | ICD-10-CM | POA: Diagnosis not present

## 2023-02-16 DIAGNOSIS — F411 Generalized anxiety disorder: Secondary | ICD-10-CM | POA: Diagnosis not present

## 2023-02-23 DIAGNOSIS — F1010 Alcohol abuse, uncomplicated: Secondary | ICD-10-CM | POA: Diagnosis not present

## 2023-02-23 DIAGNOSIS — F411 Generalized anxiety disorder: Secondary | ICD-10-CM | POA: Diagnosis not present

## 2023-02-23 DIAGNOSIS — F331 Major depressive disorder, recurrent, moderate: Secondary | ICD-10-CM | POA: Diagnosis not present

## 2023-02-23 DIAGNOSIS — F4312 Post-traumatic stress disorder, chronic: Secondary | ICD-10-CM | POA: Diagnosis not present

## 2023-03-04 ENCOUNTER — Other Ambulatory Visit (HOSPITAL_COMMUNITY): Admission: RE | Admit: 2023-03-04 | Payer: BC Managed Care – PPO | Source: Ambulatory Visit

## 2023-03-04 ENCOUNTER — Ambulatory Visit: Payer: BC Managed Care – PPO

## 2023-03-04 VITALS — BP 123/82 | HR 80 | Resp 16 | Ht 64.0 in | Wt 186.0 lb

## 2023-03-04 DIAGNOSIS — Z113 Encounter for screening for infections with a predominantly sexual mode of transmission: Secondary | ICD-10-CM

## 2023-03-04 DIAGNOSIS — N898 Other specified noninflammatory disorders of vagina: Secondary | ICD-10-CM

## 2023-03-04 NOTE — Progress Notes (Signed)
    NURSE VISIT NOTE  Subjective:    Patient ID: Linda Duran, female    DOB: 05/29/2001, 22 y.o.   MRN: [id number]  HPI  Patient is a 22 y.o. [id number] female who presents for std testing, she has no vaginal symptoms. Denies abnormal vaginal bleeding or significant pelvic pain or fever. denies dysuria, hematuria, urinary frequency, urinary urgency, flank pain, abdominal pain, and pelvic pain. Patient denies history of known exposure to STD.   Objective:    BP 123/82   Pulse 80   Resp 16   Ht 5\' 4"  (1.626 m)   Wt 186 lb (84.4 kg)   BMI 31.93 kg/m    @THIS  VISIT ONLY@  Assessment:   1. Screen for STD (sexually transmitted disease)     rule out GC or chlamydia  Plan:   GC and chlamydia DNA  probe sent to lab. Treatment: Check mychart for results and treatment if needed. ROV prn if symptoms persist or worsen.    Santiago Bumpers, CMA [facility]

## 2023-03-04 NOTE — Patient Instructions (Signed)
Preventing Sexually Transmitted Infections, Adult Sexually transmitted infections (STIs) are spread from person to person (are contagious). They are spread, or transmitted, during sex. The sex may be vaginal, anal, or oral. STIs can be passed during sexual contact with skin, genitals, mouth, or rectum. They may spread through body fluids, such as saliva, semen, blood, vaginal mucus, and urine. STIs are very common. They can happen in people of all ages. Some common STIs are: Herpes. Hepatitis B. Chlamydia. Gonorrhea. Syphilis. Trichomoniasis. Human papillomavirus (HPV). Human immunodeficiency virus (HIV). This can cause acquired immunodeficiency syndrome (AIDS). How can STIs affect me? You may not have symptoms with an STI. Even if you do not have symptoms, you can still spread the infection to others. You also still need treatment. STIs can be treated. Some STIs can be cured. Other STIs cannot be cured and will affect you for the rest of your life. Certain STIs may: Require you to take medicine for the rest of your life. Affect your ability to have children. Increase your risk for getting other STIs. Increase your risk of getting certain conditions. These may include: Cervical cancer. Pelvic inflammatory disease (PID). Organ damage or damage to other parts of your body. This can happen if the infection spreads. Cause problems during pregnancy. STIs may be spread to the baby during pregnancy or birth. Females tend to have more severe problems from STIs than males. What can increase my risk? You may be more at risk for an STI if: You do not use protection during sex. You have more than one sex partner. You have a sex partner who has other sex partners. You have sex with a person who has an STI. You have an STI, or you have had an STI before. You inject drugs or have a sex partner who injects drugs. What actions can I take to prevent STIs? The only way to fully prevent STIs is not to  have sex of any kind. This is called practicing abstinence. If you are sexually active, you can protect yourself and others by taking these actions to lower your risk of getting an STI: Lifestyle Have only one sex partner or limit the number of sex partners you have. Avoid having sex after you have alcohol or drugs. Alcohol and drugs can affect your ability to make good choices. This can lead to risky sexual behaviors. Go to prevention counseling. This can teach you how to avoid getting an STI. Barrier protection  Use methods to stop body fluids from being exchanged between partners during sex (barrier protection). These methods can be used during oral, vaginal, or anal sex. They include: External condom, for males. Internal condom, for females. Dental dam. Use a new barrier method for every sex act from start to finish. Know that a barrier method may not protect you from all STIs. Some STIs, such as herpes, are spread through skin-to-skin contact. Avoid all sexual contact if you or a partner has herpes and there is an active flare with open sores. Birth control pills, injections, implants, and intrauterine devices (IUDs) do not protect against STIs. To prevent both STIs and pregnancy, always use a condom with a second form of birth control. General information Ask your health care provider about taking pre-exposure prophylaxis (PrEP) to prevent HIV. Stay up to date on your vaccines. Some vaccines can lower your risk of getting certain STIs. These include: Hepatitis B vaccine. HPV vaccine. This is recommended for people up to age 26. Get tested for STIs. Have your   partners get tested, too. If you test positive for an STI, follow recommendations from your health care provider about treatment. Make sure your sex partners are tested and treated as well. Where to find more information Learn more about STIs from: Centers for Disease Control and Prevention (CDC): More information about certain  STIs: cdc.gov Places to get sexual health counseling and treatment for free or at a low cost: gettested.cdc.gov U.S. Department of Health and Human Services (HHS): womenshealth.gov This information is not intended to replace advice given to you by your health care provider. Make sure you discuss any questions you have with your health care provider. Document Revised: 07/09/2022 Document Reviewed: 12/12/2021 Elsevier Patient Education  2024 Elsevier Inc.  

## 2023-03-05 DIAGNOSIS — F331 Major depressive disorder, recurrent, moderate: Secondary | ICD-10-CM | POA: Diagnosis not present

## 2023-03-05 DIAGNOSIS — F900 Attention-deficit hyperactivity disorder, predominantly inattentive type: Secondary | ICD-10-CM | POA: Diagnosis not present

## 2023-03-05 DIAGNOSIS — F411 Generalized anxiety disorder: Secondary | ICD-10-CM | POA: Diagnosis not present

## 2023-03-05 DIAGNOSIS — F4312 Post-traumatic stress disorder, chronic: Secondary | ICD-10-CM | POA: Diagnosis not present

## 2023-03-08 LAB — CERVICOVAGINAL ANCILLARY ONLY
Chlamydia: NEGATIVE
Comment: NEGATIVE
Comment: NEGATIVE
Comment: NORMAL
Neisseria Gonorrhea: NEGATIVE
Trichomonas: NEGATIVE

## 2023-03-10 DIAGNOSIS — F411 Generalized anxiety disorder: Secondary | ICD-10-CM | POA: Diagnosis not present

## 2023-03-10 DIAGNOSIS — F900 Attention-deficit hyperactivity disorder, predominantly inattentive type: Secondary | ICD-10-CM | POA: Diagnosis not present

## 2023-03-10 DIAGNOSIS — F331 Major depressive disorder, recurrent, moderate: Secondary | ICD-10-CM | POA: Diagnosis not present

## 2023-03-10 DIAGNOSIS — F4312 Post-traumatic stress disorder, chronic: Secondary | ICD-10-CM | POA: Diagnosis not present

## 2023-03-19 DIAGNOSIS — F902 Attention-deficit hyperactivity disorder, combined type: Secondary | ICD-10-CM | POA: Diagnosis not present

## 2023-03-19 DIAGNOSIS — F4312 Post-traumatic stress disorder, chronic: Secondary | ICD-10-CM | POA: Diagnosis not present

## 2023-03-19 DIAGNOSIS — F331 Major depressive disorder, recurrent, moderate: Secondary | ICD-10-CM | POA: Diagnosis not present

## 2023-03-19 DIAGNOSIS — F411 Generalized anxiety disorder: Secondary | ICD-10-CM | POA: Diagnosis not present

## 2023-04-03 DIAGNOSIS — F439 Reaction to severe stress, unspecified: Secondary | ICD-10-CM | POA: Diagnosis not present

## 2023-04-03 DIAGNOSIS — F331 Major depressive disorder, recurrent, moderate: Secondary | ICD-10-CM | POA: Diagnosis not present

## 2023-04-03 DIAGNOSIS — F411 Generalized anxiety disorder: Secondary | ICD-10-CM | POA: Diagnosis not present

## 2023-04-05 DIAGNOSIS — F902 Attention-deficit hyperactivity disorder, combined type: Secondary | ICD-10-CM | POA: Diagnosis not present

## 2023-04-05 DIAGNOSIS — F411 Generalized anxiety disorder: Secondary | ICD-10-CM | POA: Diagnosis not present

## 2023-04-05 DIAGNOSIS — F331 Major depressive disorder, recurrent, moderate: Secondary | ICD-10-CM | POA: Diagnosis not present

## 2023-04-05 DIAGNOSIS — F4312 Post-traumatic stress disorder, chronic: Secondary | ICD-10-CM | POA: Diagnosis not present

## 2023-04-15 DIAGNOSIS — Z23 Encounter for immunization: Secondary | ICD-10-CM | POA: Diagnosis not present

## 2023-04-17 DIAGNOSIS — F331 Major depressive disorder, recurrent, moderate: Secondary | ICD-10-CM | POA: Diagnosis not present

## 2023-04-17 DIAGNOSIS — F411 Generalized anxiety disorder: Secondary | ICD-10-CM | POA: Diagnosis not present

## 2023-04-17 DIAGNOSIS — F439 Reaction to severe stress, unspecified: Secondary | ICD-10-CM | POA: Diagnosis not present

## 2023-04-22 DIAGNOSIS — F902 Attention-deficit hyperactivity disorder, combined type: Secondary | ICD-10-CM | POA: Diagnosis not present

## 2023-04-22 DIAGNOSIS — F4312 Post-traumatic stress disorder, chronic: Secondary | ICD-10-CM | POA: Diagnosis not present

## 2023-04-22 DIAGNOSIS — F331 Major depressive disorder, recurrent, moderate: Secondary | ICD-10-CM | POA: Diagnosis not present

## 2023-04-22 DIAGNOSIS — F411 Generalized anxiety disorder: Secondary | ICD-10-CM | POA: Diagnosis not present

## 2023-05-01 DIAGNOSIS — F411 Generalized anxiety disorder: Secondary | ICD-10-CM | POA: Diagnosis not present

## 2023-05-01 DIAGNOSIS — F331 Major depressive disorder, recurrent, moderate: Secondary | ICD-10-CM | POA: Diagnosis not present

## 2023-05-01 DIAGNOSIS — F439 Reaction to severe stress, unspecified: Secondary | ICD-10-CM | POA: Diagnosis not present

## 2023-05-05 DIAGNOSIS — F411 Generalized anxiety disorder: Secondary | ICD-10-CM | POA: Diagnosis not present

## 2023-05-05 DIAGNOSIS — F4312 Post-traumatic stress disorder, chronic: Secondary | ICD-10-CM | POA: Diagnosis not present

## 2023-05-05 DIAGNOSIS — F331 Major depressive disorder, recurrent, moderate: Secondary | ICD-10-CM | POA: Diagnosis not present

## 2023-05-05 DIAGNOSIS — F900 Attention-deficit hyperactivity disorder, predominantly inattentive type: Secondary | ICD-10-CM | POA: Diagnosis not present

## 2023-05-05 DIAGNOSIS — F902 Attention-deficit hyperactivity disorder, combined type: Secondary | ICD-10-CM | POA: Diagnosis not present

## 2023-05-15 DIAGNOSIS — F331 Major depressive disorder, recurrent, moderate: Secondary | ICD-10-CM | POA: Diagnosis not present

## 2023-05-15 DIAGNOSIS — F439 Reaction to severe stress, unspecified: Secondary | ICD-10-CM | POA: Diagnosis not present

## 2023-05-15 DIAGNOSIS — F411 Generalized anxiety disorder: Secondary | ICD-10-CM | POA: Diagnosis not present

## 2023-05-20 DIAGNOSIS — F331 Major depressive disorder, recurrent, moderate: Secondary | ICD-10-CM | POA: Diagnosis not present

## 2023-05-20 DIAGNOSIS — F411 Generalized anxiety disorder: Secondary | ICD-10-CM | POA: Diagnosis not present

## 2023-05-20 DIAGNOSIS — F902 Attention-deficit hyperactivity disorder, combined type: Secondary | ICD-10-CM | POA: Diagnosis not present

## 2023-05-20 DIAGNOSIS — F4312 Post-traumatic stress disorder, chronic: Secondary | ICD-10-CM | POA: Diagnosis not present

## 2023-06-12 DIAGNOSIS — F411 Generalized anxiety disorder: Secondary | ICD-10-CM | POA: Diagnosis not present

## 2023-06-12 DIAGNOSIS — F331 Major depressive disorder, recurrent, moderate: Secondary | ICD-10-CM | POA: Diagnosis not present

## 2023-06-17 DIAGNOSIS — F411 Generalized anxiety disorder: Secondary | ICD-10-CM | POA: Diagnosis not present

## 2023-06-17 DIAGNOSIS — F4312 Post-traumatic stress disorder, chronic: Secondary | ICD-10-CM | POA: Diagnosis not present

## 2023-06-17 DIAGNOSIS — F902 Attention-deficit hyperactivity disorder, combined type: Secondary | ICD-10-CM | POA: Diagnosis not present

## 2023-06-17 DIAGNOSIS — F331 Major depressive disorder, recurrent, moderate: Secondary | ICD-10-CM | POA: Diagnosis not present

## 2023-06-26 DIAGNOSIS — F439 Reaction to severe stress, unspecified: Secondary | ICD-10-CM | POA: Diagnosis not present

## 2023-06-26 DIAGNOSIS — F331 Major depressive disorder, recurrent, moderate: Secondary | ICD-10-CM | POA: Diagnosis not present

## 2023-06-26 DIAGNOSIS — F411 Generalized anxiety disorder: Secondary | ICD-10-CM | POA: Diagnosis not present

## 2023-07-09 DIAGNOSIS — F411 Generalized anxiety disorder: Secondary | ICD-10-CM | POA: Diagnosis not present

## 2023-07-09 DIAGNOSIS — F439 Reaction to severe stress, unspecified: Secondary | ICD-10-CM | POA: Diagnosis not present

## 2023-07-09 DIAGNOSIS — F331 Major depressive disorder, recurrent, moderate: Secondary | ICD-10-CM | POA: Diagnosis not present

## 2023-07-13 DIAGNOSIS — F4312 Post-traumatic stress disorder, chronic: Secondary | ICD-10-CM | POA: Diagnosis not present

## 2023-07-13 DIAGNOSIS — F411 Generalized anxiety disorder: Secondary | ICD-10-CM | POA: Diagnosis not present

## 2023-07-13 DIAGNOSIS — F902 Attention-deficit hyperactivity disorder, combined type: Secondary | ICD-10-CM | POA: Diagnosis not present

## 2023-07-13 DIAGNOSIS — F331 Major depressive disorder, recurrent, moderate: Secondary | ICD-10-CM | POA: Diagnosis not present

## 2023-07-19 DIAGNOSIS — F902 Attention-deficit hyperactivity disorder, combined type: Secondary | ICD-10-CM | POA: Diagnosis not present

## 2023-07-24 DIAGNOSIS — F411 Generalized anxiety disorder: Secondary | ICD-10-CM | POA: Diagnosis not present

## 2023-07-24 DIAGNOSIS — F331 Major depressive disorder, recurrent, moderate: Secondary | ICD-10-CM | POA: Diagnosis not present

## 2023-07-24 DIAGNOSIS — F439 Reaction to severe stress, unspecified: Secondary | ICD-10-CM | POA: Diagnosis not present

## 2023-08-03 DIAGNOSIS — F4312 Post-traumatic stress disorder, chronic: Secondary | ICD-10-CM | POA: Diagnosis not present

## 2023-08-03 DIAGNOSIS — F331 Major depressive disorder, recurrent, moderate: Secondary | ICD-10-CM | POA: Diagnosis not present

## 2023-08-03 DIAGNOSIS — F902 Attention-deficit hyperactivity disorder, combined type: Secondary | ICD-10-CM | POA: Diagnosis not present

## 2023-08-03 DIAGNOSIS — F411 Generalized anxiety disorder: Secondary | ICD-10-CM | POA: Diagnosis not present

## 2023-08-07 DIAGNOSIS — F331 Major depressive disorder, recurrent, moderate: Secondary | ICD-10-CM | POA: Diagnosis not present

## 2023-08-07 DIAGNOSIS — F411 Generalized anxiety disorder: Secondary | ICD-10-CM | POA: Diagnosis not present

## 2023-08-07 DIAGNOSIS — F439 Reaction to severe stress, unspecified: Secondary | ICD-10-CM | POA: Diagnosis not present

## 2023-08-18 DIAGNOSIS — F902 Attention-deficit hyperactivity disorder, combined type: Secondary | ICD-10-CM | POA: Diagnosis not present

## 2023-08-18 DIAGNOSIS — F331 Major depressive disorder, recurrent, moderate: Secondary | ICD-10-CM | POA: Diagnosis not present

## 2023-08-18 DIAGNOSIS — F411 Generalized anxiety disorder: Secondary | ICD-10-CM | POA: Diagnosis not present

## 2023-08-18 DIAGNOSIS — F4312 Post-traumatic stress disorder, chronic: Secondary | ICD-10-CM | POA: Diagnosis not present

## 2023-08-20 DIAGNOSIS — F411 Generalized anxiety disorder: Secondary | ICD-10-CM | POA: Diagnosis not present

## 2023-08-20 DIAGNOSIS — F439 Reaction to severe stress, unspecified: Secondary | ICD-10-CM | POA: Diagnosis not present

## 2023-08-20 DIAGNOSIS — F331 Major depressive disorder, recurrent, moderate: Secondary | ICD-10-CM | POA: Diagnosis not present

## 2023-09-02 DIAGNOSIS — F331 Major depressive disorder, recurrent, moderate: Secondary | ICD-10-CM | POA: Diagnosis not present

## 2023-09-02 DIAGNOSIS — F4312 Post-traumatic stress disorder, chronic: Secondary | ICD-10-CM | POA: Diagnosis not present

## 2023-09-02 DIAGNOSIS — F411 Generalized anxiety disorder: Secondary | ICD-10-CM | POA: Diagnosis not present

## 2023-09-02 DIAGNOSIS — F902 Attention-deficit hyperactivity disorder, combined type: Secondary | ICD-10-CM | POA: Diagnosis not present

## 2023-09-03 DIAGNOSIS — F411 Generalized anxiety disorder: Secondary | ICD-10-CM | POA: Diagnosis not present

## 2023-09-03 DIAGNOSIS — F331 Major depressive disorder, recurrent, moderate: Secondary | ICD-10-CM | POA: Diagnosis not present

## 2023-09-03 DIAGNOSIS — F439 Reaction to severe stress, unspecified: Secondary | ICD-10-CM | POA: Diagnosis not present

## 2023-09-17 DIAGNOSIS — F331 Major depressive disorder, recurrent, moderate: Secondary | ICD-10-CM | POA: Diagnosis not present

## 2023-09-17 DIAGNOSIS — F439 Reaction to severe stress, unspecified: Secondary | ICD-10-CM | POA: Diagnosis not present

## 2023-09-17 DIAGNOSIS — F411 Generalized anxiety disorder: Secondary | ICD-10-CM | POA: Diagnosis not present

## 2023-09-23 DIAGNOSIS — F331 Major depressive disorder, recurrent, moderate: Secondary | ICD-10-CM | POA: Diagnosis not present

## 2023-09-23 DIAGNOSIS — F4312 Post-traumatic stress disorder, chronic: Secondary | ICD-10-CM | POA: Diagnosis not present

## 2023-09-23 DIAGNOSIS — F411 Generalized anxiety disorder: Secondary | ICD-10-CM | POA: Diagnosis not present

## 2023-09-23 DIAGNOSIS — F902 Attention-deficit hyperactivity disorder, combined type: Secondary | ICD-10-CM | POA: Diagnosis not present

## 2023-10-01 DIAGNOSIS — F411 Generalized anxiety disorder: Secondary | ICD-10-CM | POA: Diagnosis not present

## 2023-10-01 DIAGNOSIS — F439 Reaction to severe stress, unspecified: Secondary | ICD-10-CM | POA: Diagnosis not present

## 2023-10-01 DIAGNOSIS — F331 Major depressive disorder, recurrent, moderate: Secondary | ICD-10-CM | POA: Diagnosis not present

## 2023-10-15 DIAGNOSIS — F439 Reaction to severe stress, unspecified: Secondary | ICD-10-CM | POA: Diagnosis not present

## 2023-10-15 DIAGNOSIS — F331 Major depressive disorder, recurrent, moderate: Secondary | ICD-10-CM | POA: Diagnosis not present

## 2023-10-15 DIAGNOSIS — F411 Generalized anxiety disorder: Secondary | ICD-10-CM | POA: Diagnosis not present

## 2023-10-22 DIAGNOSIS — F4312 Post-traumatic stress disorder, chronic: Secondary | ICD-10-CM | POA: Diagnosis not present

## 2023-10-22 DIAGNOSIS — G4700 Insomnia, unspecified: Secondary | ICD-10-CM | POA: Diagnosis not present

## 2023-10-22 DIAGNOSIS — F331 Major depressive disorder, recurrent, moderate: Secondary | ICD-10-CM | POA: Diagnosis not present

## 2023-10-22 DIAGNOSIS — F411 Generalized anxiety disorder: Secondary | ICD-10-CM | POA: Diagnosis not present

## 2023-10-29 DIAGNOSIS — F439 Reaction to severe stress, unspecified: Secondary | ICD-10-CM | POA: Diagnosis not present

## 2023-10-29 DIAGNOSIS — F331 Major depressive disorder, recurrent, moderate: Secondary | ICD-10-CM | POA: Diagnosis not present

## 2023-10-29 DIAGNOSIS — F411 Generalized anxiety disorder: Secondary | ICD-10-CM | POA: Diagnosis not present

## 2023-11-09 DIAGNOSIS — F902 Attention-deficit hyperactivity disorder, combined type: Secondary | ICD-10-CM | POA: Diagnosis not present

## 2023-11-09 DIAGNOSIS — F331 Major depressive disorder, recurrent, moderate: Secondary | ICD-10-CM | POA: Diagnosis not present

## 2023-11-09 DIAGNOSIS — F411 Generalized anxiety disorder: Secondary | ICD-10-CM | POA: Diagnosis not present

## 2023-11-09 DIAGNOSIS — F4312 Post-traumatic stress disorder, chronic: Secondary | ICD-10-CM | POA: Diagnosis not present

## 2023-11-12 DIAGNOSIS — F411 Generalized anxiety disorder: Secondary | ICD-10-CM | POA: Diagnosis not present

## 2023-11-12 DIAGNOSIS — F331 Major depressive disorder, recurrent, moderate: Secondary | ICD-10-CM | POA: Diagnosis not present

## 2023-11-12 DIAGNOSIS — F439 Reaction to severe stress, unspecified: Secondary | ICD-10-CM | POA: Diagnosis not present

## 2023-11-21 DIAGNOSIS — H109 Unspecified conjunctivitis: Secondary | ICD-10-CM | POA: Diagnosis not present

## 2023-11-25 DIAGNOSIS — R0683 Snoring: Secondary | ICD-10-CM | POA: Diagnosis not present

## 2023-11-25 DIAGNOSIS — G4719 Other hypersomnia: Secondary | ICD-10-CM | POA: Diagnosis not present

## 2023-11-26 DIAGNOSIS — F411 Generalized anxiety disorder: Secondary | ICD-10-CM | POA: Diagnosis not present

## 2023-11-26 DIAGNOSIS — F439 Reaction to severe stress, unspecified: Secondary | ICD-10-CM | POA: Diagnosis not present

## 2023-11-26 DIAGNOSIS — F331 Major depressive disorder, recurrent, moderate: Secondary | ICD-10-CM | POA: Diagnosis not present

## 2023-12-03 ENCOUNTER — Ambulatory Visit: Admitting: Physician Assistant

## 2023-12-08 DIAGNOSIS — F902 Attention-deficit hyperactivity disorder, combined type: Secondary | ICD-10-CM | POA: Diagnosis not present

## 2023-12-08 DIAGNOSIS — F4312 Post-traumatic stress disorder, chronic: Secondary | ICD-10-CM | POA: Diagnosis not present

## 2023-12-08 DIAGNOSIS — F411 Generalized anxiety disorder: Secondary | ICD-10-CM | POA: Diagnosis not present

## 2023-12-08 DIAGNOSIS — F331 Major depressive disorder, recurrent, moderate: Secondary | ICD-10-CM | POA: Diagnosis not present

## 2023-12-09 DIAGNOSIS — S0501XA Injury of conjunctiva and corneal abrasion without foreign body, right eye, initial encounter: Secondary | ICD-10-CM | POA: Diagnosis not present

## 2023-12-23 ENCOUNTER — Encounter: Admitting: Family Medicine

## 2023-12-24 DIAGNOSIS — F411 Generalized anxiety disorder: Secondary | ICD-10-CM | POA: Diagnosis not present

## 2023-12-24 DIAGNOSIS — F331 Major depressive disorder, recurrent, moderate: Secondary | ICD-10-CM | POA: Diagnosis not present

## 2023-12-24 DIAGNOSIS — F439 Reaction to severe stress, unspecified: Secondary | ICD-10-CM | POA: Diagnosis not present

## 2024-01-05 DIAGNOSIS — F331 Major depressive disorder, recurrent, moderate: Secondary | ICD-10-CM | POA: Diagnosis not present

## 2024-01-05 DIAGNOSIS — F902 Attention-deficit hyperactivity disorder, combined type: Secondary | ICD-10-CM | POA: Diagnosis not present

## 2024-01-05 DIAGNOSIS — F4312 Post-traumatic stress disorder, chronic: Secondary | ICD-10-CM | POA: Diagnosis not present

## 2024-01-05 DIAGNOSIS — F411 Generalized anxiety disorder: Secondary | ICD-10-CM | POA: Diagnosis not present

## 2024-01-07 DIAGNOSIS — F439 Reaction to severe stress, unspecified: Secondary | ICD-10-CM | POA: Diagnosis not present

## 2024-01-07 DIAGNOSIS — F411 Generalized anxiety disorder: Secondary | ICD-10-CM | POA: Diagnosis not present

## 2024-01-07 DIAGNOSIS — F331 Major depressive disorder, recurrent, moderate: Secondary | ICD-10-CM | POA: Diagnosis not present

## 2024-01-21 DIAGNOSIS — F439 Reaction to severe stress, unspecified: Secondary | ICD-10-CM | POA: Diagnosis not present

## 2024-01-21 DIAGNOSIS — F331 Major depressive disorder, recurrent, moderate: Secondary | ICD-10-CM | POA: Diagnosis not present

## 2024-01-21 DIAGNOSIS — F411 Generalized anxiety disorder: Secondary | ICD-10-CM | POA: Diagnosis not present

## 2024-02-03 DIAGNOSIS — F331 Major depressive disorder, recurrent, moderate: Secondary | ICD-10-CM | POA: Diagnosis not present

## 2024-02-03 DIAGNOSIS — F4312 Post-traumatic stress disorder, chronic: Secondary | ICD-10-CM | POA: Diagnosis not present

## 2024-02-03 DIAGNOSIS — F411 Generalized anxiety disorder: Secondary | ICD-10-CM | POA: Diagnosis not present

## 2024-02-03 DIAGNOSIS — F902 Attention-deficit hyperactivity disorder, combined type: Secondary | ICD-10-CM | POA: Diagnosis not present

## 2024-02-04 DIAGNOSIS — F331 Major depressive disorder, recurrent, moderate: Secondary | ICD-10-CM | POA: Diagnosis not present

## 2024-02-04 DIAGNOSIS — F411 Generalized anxiety disorder: Secondary | ICD-10-CM | POA: Diagnosis not present

## 2024-02-04 DIAGNOSIS — F439 Reaction to severe stress, unspecified: Secondary | ICD-10-CM | POA: Diagnosis not present

## 2024-02-13 DIAGNOSIS — G4733 Obstructive sleep apnea (adult) (pediatric): Secondary | ICD-10-CM | POA: Diagnosis not present

## 2024-02-13 DIAGNOSIS — R0683 Snoring: Secondary | ICD-10-CM | POA: Diagnosis not present

## 2024-02-13 DIAGNOSIS — G4719 Other hypersomnia: Secondary | ICD-10-CM | POA: Diagnosis not present

## 2024-02-14 DIAGNOSIS — G4719 Other hypersomnia: Secondary | ICD-10-CM | POA: Diagnosis not present

## 2024-02-14 DIAGNOSIS — R0683 Snoring: Secondary | ICD-10-CM | POA: Diagnosis not present

## 2024-02-18 DIAGNOSIS — F439 Reaction to severe stress, unspecified: Secondary | ICD-10-CM | POA: Diagnosis not present

## 2024-02-18 DIAGNOSIS — F331 Major depressive disorder, recurrent, moderate: Secondary | ICD-10-CM | POA: Diagnosis not present

## 2024-02-18 DIAGNOSIS — F411 Generalized anxiety disorder: Secondary | ICD-10-CM | POA: Diagnosis not present

## 2024-03-08 DIAGNOSIS — F902 Attention-deficit hyperactivity disorder, combined type: Secondary | ICD-10-CM | POA: Diagnosis not present

## 2024-03-08 DIAGNOSIS — F4312 Post-traumatic stress disorder, chronic: Secondary | ICD-10-CM | POA: Diagnosis not present

## 2024-03-08 DIAGNOSIS — F411 Generalized anxiety disorder: Secondary | ICD-10-CM | POA: Diagnosis not present

## 2024-03-08 DIAGNOSIS — F331 Major depressive disorder, recurrent, moderate: Secondary | ICD-10-CM | POA: Diagnosis not present

## 2024-03-16 ENCOUNTER — Ambulatory Visit: Admitting: Physician Assistant

## 2024-03-16 VITALS — BP 115/83 | HR 98 | Temp 97.9°F | Ht 64.5 in | Wt 198.5 lb

## 2024-03-16 DIAGNOSIS — Z7689 Persons encountering health services in other specified circumstances: Secondary | ICD-10-CM

## 2024-03-16 DIAGNOSIS — F1210 Cannabis abuse, uncomplicated: Secondary | ICD-10-CM | POA: Diagnosis not present

## 2024-03-16 DIAGNOSIS — F419 Anxiety disorder, unspecified: Secondary | ICD-10-CM | POA: Diagnosis not present

## 2024-03-16 DIAGNOSIS — F1010 Alcohol abuse, uncomplicated: Secondary | ICD-10-CM | POA: Diagnosis not present

## 2024-03-16 DIAGNOSIS — F32A Depression, unspecified: Secondary | ICD-10-CM

## 2024-03-16 DIAGNOSIS — Z23 Encounter for immunization: Secondary | ICD-10-CM

## 2024-03-16 DIAGNOSIS — E669 Obesity, unspecified: Secondary | ICD-10-CM

## 2024-03-16 DIAGNOSIS — R5383 Other fatigue: Secondary | ICD-10-CM | POA: Diagnosis not present

## 2024-03-16 NOTE — Progress Notes (Signed)
 New patient visit  Patient: Linda Duran   DOB: September 02, 2000   23 y.o. Female  MRN: [id number] Visit Date: 03/16/2024  Today's healthcare provider: Jolynn Spencer, PA-C   Chief Complaint  Patient presents with   Establish Care    Patient presents to establish care with new pcp.  Reports fatigue worsening over the last 6 months. Would like to receive flu shot today    Subjective    Linda Duran is a 23 y.o. female who presents today as a new patient to establish care.  HPI HPI     Establish Care    Additional comments: Patient presents to establish care with new pcp.  Reports fatigue worsening over the last 6 months. Would like to receive flu shot today       Last edited by Cherry Chiquita HERO, CMA on 03/16/2024  3:18 PM.     Discussed the use of AI scribe software for clinical note transcription with the patient, who gave verbal consent to proceed.  History of Present Illness Linda Duran is a 23 year old female who presents with extreme fatigue.  She experiences extreme fatigue for the past six months, requiring one or two naps daily despite feeling refreshed upon waking. 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  She has minor sleep apnea confirmed by a recent sleep study but does not require a CPAP machine. She occasionally snores. No chest pain, shortness of breath, or menstrual irregularities. She does not get periods due to her IUD, placed in early 2023.  She is currently seeing a therapist biweekly and a psychiatrist monthly for depression and anxiety, for which she is on medication.  She has a history of alcohol use and has attended AA for alcoholism. Currently, she drinks liquor once or twice a week, typically in the form of shots, but has recently cut back. She smokes marijuana three to four times a week. No smoking or vaping.  No known family history of diabetes and has not had blood work done  in several years. She is open to having blood work done to check for diabetes, vitamin deficiencies, and thyroid  function.     03/16/2024    3:15 PM 02/26/2022    1:56 PM  PHQ9 SCORE ONLY  PHQ-9 Total Score 18 14      Data saved with a previous flowsheet row definition      03/16/2024    3:16 PM 02/26/2022    1:57 PM  GAD 7 : Generalized Anxiety Score  Nervous, Anxious, on Edge 2 2  Control/stop worrying 3 2  Worry too much - different things 3 3  Trouble relaxing 2 2  Restless 1 1  Easily annoyed or irritable 3 2  Afraid - awful might happen 1 1  Total GAD 7 Score 15 13  Anxiety Difficulty Somewhat difficult Somewhat difficult      Past Medical History:  Diagnosis Date   Anxiety    Depression    History reviewed. No pertinent surgical history. Family Status  Relation Name Status   Mother  Alive   Father Enisa Runyan Alive   PGM Ronal Liberty Alive  No partnership data on file   Family History  Problem Relation Age of Onset   Depression Father    Stroke Paternal Grandmother    Social History   Socioeconomic History   Marital status: Legally Separated    Spouse name: Not on file  Number of children: 1   Years of education: Not on file   Highest education level: Some college, no degree  Occupational History   Not on file  Tobacco Use   Smoking status: Never   Smokeless tobacco: Never  Vaping Use   Vaping status: Never Used  Substance and Sexual Activity   Alcohol use: Yes    Comment: Less than weekly   Drug use: Yes    Types: Marijuana    Comment: Currently   Sexual activity: Yes    Birth control/protection: I.U.D.  Other Topics Concern   Not on file  Social History Narrative   Lives with her daughter    Social Drivers of Health   Financial Resource Strain: Medium Risk (03/16/2024)   Overall Financial Resource Strain (CARDIA)    Difficulty of Paying Living Expenses: Somewhat hard  Food Insecurity: No Food Insecurity (03/16/2024)   Hunger Vital Sign     Worried About Running Out of Food in the Last Year: Never true    Ran Out of Food in the Last Year: Never true  Transportation Needs: No Transportation Needs (03/16/2024)   PRAPARE - Administrator, Civil Service (Medical): No    Lack of Transportation (Non-Medical): No  Physical Activity: Inactive (03/16/2024)   Exercise Vital Sign    Days of Exercise per Week: 0 days    Minutes of Exercise per Session: Not on file  Stress: Stress Concern Present (03/16/2024)   [employer] of Occupational Health - Occupational Stress Questionnaire    Feeling of Stress: To some extent  Social Connections: Socially Isolated (03/16/2024)   Social Connection and Isolation Panel    Frequency of Communication with Friends and Family: More than three times a week    Frequency of Social Gatherings with Friends and Family: Twice a week    Attends Religious Services: Never    Database administrator or Organizations: No    Attends Engineer, structural: Not on file    Marital Status: Separated   Outpatient Medications Prior to Visit  Medication Sig   ARIPiprazole (ABILIFY) 5 MG tablet Take 5 mg by mouth daily.   atomoxetine (STRATTERA) 60 MG capsule Take 60 mg by mouth daily.   lamoTRIgine (LAMICTAL) 100 MG tablet Take 100 mg by mouth 2 (two) times daily.   levonorgestrel  (MIRENA ) 20 MCG/DAY IUD 1 each by Intrauterine route once.   No facility-administered medications prior to visit.   No Known Allergies  Immunization History  Administered Date(s) Administered   DTaP 01/06/2006   Dtap, Unspecified 03/08/2001, 05/10/2001, 08/02/2001, 03/29/2002   HIB, Unspecified 03/08/2001, 05/10/2001, 08/02/2001, 03/29/2002   HPV 9-valent 05/18/2014   HPV Quadrivalent 11/01/2013, 01/03/2014   Hep B, Unspecified 05-07-2001, 03/08/2001, 11/09/2001   Hepatitis A, Ped/Adol-2 Dose 01/22/2012, 09/21/2018   IPV 01/06/2006   Influenza, Seasonal, Injecte, Preservative Fre 04/15/2023, 03/16/2024    Influenza,inj,Quad PF,6+ Mos 05/18/2014, 07/08/2021   MMR 03/29/2002, 01/06/2006   Meningococcal B, OMV 09/21/2018   Meningococcal Conjugate 09/21/2018   PFIZER Comirnaty(Gray Top)Covid-19 Tri-Sucrose Vaccine 01/22/2021   PFIZER(Purple Top)SARS-COV-2 Vaccination 02/13/2020, 03/11/2020   Pfizer(Comirnaty)Fall Seasonal Vaccine 12 years and older 04/15/2023   Pneumococcal Conjugate PCV 7 05/10/2001, 08/02/2001, 11/09/2001, 03/29/2002   Polio, Unspecified 03/08/2001, 05/10/2001, 08/02/2001   Tdap 01/22/2012, 04/25/2020   Varicella 03/29/2002, 01/06/2006    Health Maintenance  Topic Date Due   Meningococcal B Vaccine (2 of 2 - Bexsero SCDM 2-dose series) 03/24/2019   CHLAMYDIA SCREENING  03/03/2024  Hepatitis C Screening  03/16/2025 (Originally 01/06/2019)   HIV Screening  03/16/2025 (Originally 01/06/2016)   Cervical Cancer Screening (Pap smear)  03/05/2025   DTaP/Tdap/Td (8 - Td or Tdap) 04/25/2030   Influenza Vaccine  Completed   HPV VACCINES  Completed   COVID-19 Vaccine  Completed   Pneumococcal Vaccine  Aged Out    Patient Care Team: Beecher Furio, PA-C as PCP - General (Physician Assistant)  Review of Systems  All other systems reviewed and are negative.  Except see HPI       Objective    BP 115/83 (BP Location: Right Arm, Patient Position: Sitting, Cuff Size: Normal)   Pulse 98   Temp 97.9 F (36.6 C) (Oral)   Ht 5' 4.5 (1.638 m)   Wt 198 lb 8 oz (90 kg)   SpO2 98%   BMI 33.55 kg/m     Physical Exam Vitals reviewed.  Constitutional:      General: She is not in acute distress.    Appearance: Normal appearance. She is well-developed. She is not diaphoretic.  HENT:     Head: Normocephalic and atraumatic.  Eyes:     General: No scleral icterus.    Conjunctiva/sclera: Conjunctivae normal.  Neck:     Thyroid : No thyromegaly.  Cardiovascular:     Rate and Rhythm: Normal rate and regular rhythm.     Pulses: Normal pulses.     Heart sounds: Normal heart  sounds. No murmur heard. Pulmonary:     Effort: Pulmonary effort is normal. No respiratory distress.     Breath sounds: Normal breath sounds. No wheezing, rhonchi or rales.  Musculoskeletal:     Cervical back: Neck supple.     Right lower leg: No edema.     Left lower leg: No edema.  Lymphadenopathy:     Cervical: No cervical adenopathy.  Skin:    General: Skin is warm and dry.     Findings: No rash.  Neurological:     Mental Status: She is alert and oriented to person, place, and time. Mental status is at baseline.  Psychiatric:        Mood and Affect: Mood normal.        Behavior: Behavior normal.     Depression Screen    03/16/2024    3:15 PM 02/26/2022    1:56 PM  PHQ 2/9 Scores  PHQ - 2 Score 5 3  PHQ- 9 Score 18 14   No results found for any visits on 03/16/24.  Assessment & Plan      Assessment & Plan Fatigue Experiencing extreme fatigue for six months. Minor chronic sleep apnea identified, no CPAP required. Possible contributing factors include demanding work schedule and medication side effects. - Order blood work to check for diabetes and vitamin deficiencies. - Check TSH levels. Advised healthy diet and regular exercise Will follow-up  Anxiety disorder/Depression/ADHD? Chronic and stable Managed with biweekly therapy and monthly psychiatric care. Currently on medication for anxiety. On abilify 5mg , strattera 60mg , lamictal 100mg  Managed by [facility]  Alcohol abuse Chronic Currently not drinking heavily, averaging one to two times per week. 5 to 6 drinks in one setting. Previously attended [employer] - Encourage continued attendance at [employer]  Cannabis abuse Chronic Reports smoking marijuana three to four times a week. Advised to stop due to potential health effects and medication interactions. - Advise cessation of marijuana use.   Encounter to establish care Welcomed to our clinic Reviewed past medical hx, social hx, family  hx and surgical  hx Pt advised to send all vaccination records or screening  Fatigue, unspecified type (Primary)  - Hemoglobin A1c - VITAMIN D  25 Hydroxy (Vit-D Deficiency, Fractures) - Vitamin B12 - TSH - CBC with Differential/Platelet - Comprehensive metabolic panel with GFR   Immunization due  - Flu vaccine trivalent PF, 6mos and older(Flulaval,Afluria,Fluarix,Fluzone)  Obestity Chronic and stable Body mass index is 33.55 kg/m. Weight loss of 5% of pt's current weight via healthy diet and daily exercise encouraged. Labwork ordered Will follow-up  Return in about 6 weeks (around 04/27/2024) for CPE.    The patient was advised to call back or seek an in-person evaluation if the symptoms worsen or if the condition fails to improve as anticipated.  I discussed the assessment and treatment plan with the patient. The patient was provided an opportunity to ask questions and all were answered. The patient agreed with the plan and demonstrated an understanding of the instructions.  I, Bren Borys, PA-C have reviewed all documentation for this visit. The documentation on  03/16/2024   for the exam, diagnosis, procedures, and orders are all accurate and complete.  Jolynn Spencer, [facility], MMS [facility] [phone] (phone) [phone] (fax)  [facility]

## 2024-03-17 ENCOUNTER — Ambulatory Visit: Payer: Self-pay | Admitting: Physician Assistant

## 2024-03-17 DIAGNOSIS — F439 Reaction to severe stress, unspecified: Secondary | ICD-10-CM | POA: Diagnosis not present

## 2024-03-17 DIAGNOSIS — F331 Major depressive disorder, recurrent, moderate: Secondary | ICD-10-CM | POA: Diagnosis not present

## 2024-03-17 DIAGNOSIS — F411 Generalized anxiety disorder: Secondary | ICD-10-CM | POA: Diagnosis not present

## 2024-03-17 LAB — HEMOGLOBIN A1C
Est. average glucose Bld gHb Est-mCnc: 108 mg/dL
Hgb A1c MFr Bld: 5.4 % (ref 4.8–5.6)

## 2024-03-17 LAB — COMPREHENSIVE METABOLIC PANEL WITH GFR
ALT: 21 IU/L (ref 0–32)
AST: 17 IU/L (ref 0–40)
Albumin: 4.7 g/dL (ref 4.0–5.0)
Alkaline Phosphatase: 79 IU/L (ref 44–121)
BUN/Creatinine Ratio: 11 (ref 9–23)
BUN: 11 mg/dL (ref 6–20)
Bilirubin Total: 0.6 mg/dL (ref 0.0–1.2)
CO2: 22 mmol/L (ref 20–29)
Calcium: 10.1 mg/dL (ref 8.7–10.2)
Chloride: 104 mmol/L (ref 96–106)
Creatinine, Ser: 1.04 mg/dL — ABNORMAL HIGH (ref 0.57–1.00)
Globulin, Total: 2.4 g/dL (ref 1.5–4.5)
Glucose: 93 mg/dL (ref 70–99)
Potassium: 4.8 mmol/L (ref 3.5–5.2)
Sodium: 142 mmol/L (ref 134–144)
Total Protein: 7.1 g/dL (ref 6.0–8.5)
eGFR: 77 mL/min/1.73 (ref >59)

## 2024-03-17 LAB — CBC WITH DIFFERENTIAL/PLATELET
Basophils Absolute: 0 x10E3/uL (ref 0.0–0.2)
Basos: 0 %
EOS (ABSOLUTE): 0.1 x10E3/uL (ref 0.0–0.4)
Eos: 1 %
Hematocrit: 40.6 % (ref 34.0–46.6)
Hemoglobin: 13.7 g/dL (ref 11.1–15.9)
Immature Grans (Abs): 0 x10E3/uL (ref 0.0–0.1)
Immature Granulocytes: 0 %
Lymphocytes Absolute: 2.1 x10E3/uL (ref 0.7–3.1)
Lymphs: 22 %
MCH: 31.1 pg (ref 26.6–33.0)
MCHC: 33.7 g/dL (ref 31.5–35.7)
MCV: 92 fL (ref 79–97)
Monocytes Absolute: 0.6 x10E3/uL (ref 0.1–0.9)
Monocytes: 7 %
Neutrophils Absolute: 6.5 x10E3/uL (ref 1.4–7.0)
Neutrophils: 70 %
Platelets: 416 x10E3/uL (ref 150–450)
RBC: 4.41 x10E6/uL (ref 3.77–5.28)
RDW: 12.1 % (ref 11.7–15.4)
WBC: 9.3 x10E3/uL (ref 3.4–10.8)

## 2024-03-17 LAB — VITAMIN B12: Vitamin B-12: 705 pg/mL (ref 232–1245)

## 2024-03-17 LAB — VITAMIN D 25 HYDROXY (VIT D DEFICIENCY, FRACTURES): Vit D, 25-Hydroxy: 27 ng/mL — ABNORMAL LOW (ref 30.0–100.0)

## 2024-03-17 LAB — TSH: TSH: 1.86 u[IU]/mL (ref 0.450–4.500)

## 2024-03-31 DIAGNOSIS — F331 Major depressive disorder, recurrent, moderate: Secondary | ICD-10-CM | POA: Diagnosis not present

## 2024-03-31 DIAGNOSIS — F411 Generalized anxiety disorder: Secondary | ICD-10-CM | POA: Diagnosis not present

## 2024-03-31 DIAGNOSIS — F439 Reaction to severe stress, unspecified: Secondary | ICD-10-CM | POA: Diagnosis not present

## 2024-04-05 DIAGNOSIS — F902 Attention-deficit hyperactivity disorder, combined type: Secondary | ICD-10-CM | POA: Diagnosis not present

## 2024-04-05 DIAGNOSIS — F4312 Post-traumatic stress disorder, chronic: Secondary | ICD-10-CM | POA: Diagnosis not present

## 2024-04-05 DIAGNOSIS — F411 Generalized anxiety disorder: Secondary | ICD-10-CM | POA: Diagnosis not present

## 2024-04-05 DIAGNOSIS — F331 Major depressive disorder, recurrent, moderate: Secondary | ICD-10-CM | POA: Diagnosis not present

## 2024-04-14 DIAGNOSIS — F331 Major depressive disorder, recurrent, moderate: Secondary | ICD-10-CM | POA: Diagnosis not present

## 2024-04-14 DIAGNOSIS — F439 Reaction to severe stress, unspecified: Secondary | ICD-10-CM | POA: Diagnosis not present

## 2024-04-14 DIAGNOSIS — F411 Generalized anxiety disorder: Secondary | ICD-10-CM | POA: Diagnosis not present

## 2024-04-28 DIAGNOSIS — F439 Reaction to severe stress, unspecified: Secondary | ICD-10-CM | POA: Diagnosis not present

## 2024-04-28 DIAGNOSIS — F331 Major depressive disorder, recurrent, moderate: Secondary | ICD-10-CM | POA: Diagnosis not present

## 2024-04-28 DIAGNOSIS — F411 Generalized anxiety disorder: Secondary | ICD-10-CM | POA: Diagnosis not present

## 2024-05-03 ENCOUNTER — Ambulatory Visit (INDEPENDENT_AMBULATORY_CARE_PROVIDER_SITE_OTHER): Admitting: Physician Assistant

## 2024-05-03 VITALS — BP 111/80 | HR 99 | Resp 16 | Ht 64.0 in | Wt 201.0 lb

## 2024-05-03 DIAGNOSIS — F1010 Alcohol abuse, uncomplicated: Secondary | ICD-10-CM

## 2024-05-03 DIAGNOSIS — E559 Vitamin D deficiency, unspecified: Secondary | ICD-10-CM

## 2024-05-03 DIAGNOSIS — F419 Anxiety disorder, unspecified: Secondary | ICD-10-CM

## 2024-05-03 DIAGNOSIS — Z0000 Encounter for general adult medical examination without abnormal findings: Secondary | ICD-10-CM

## 2024-05-03 DIAGNOSIS — Z0001 Encounter for general adult medical examination with abnormal findings: Secondary | ICD-10-CM | POA: Diagnosis not present

## 2024-05-03 DIAGNOSIS — F32A Depression, unspecified: Secondary | ICD-10-CM

## 2024-05-03 DIAGNOSIS — G4733 Obstructive sleep apnea (adult) (pediatric): Secondary | ICD-10-CM

## 2024-05-03 DIAGNOSIS — Z23 Encounter for immunization: Secondary | ICD-10-CM | POA: Diagnosis not present

## 2024-05-03 DIAGNOSIS — E049 Nontoxic goiter, unspecified: Secondary | ICD-10-CM

## 2024-05-03 DIAGNOSIS — F1210 Cannabis abuse, uncomplicated: Secondary | ICD-10-CM

## 2024-05-03 NOTE — Progress Notes (Signed)
 Complete physical exam  Patient: Linda Duran   DOB: [birth year]   23 y.o. Female  MRN: [id number] Visit Date: 05/03/2024  Today's healthcare provider: Jolynn Spencer, PA-C   Chief Complaint  Patient presents with   Annual Exam    Sleep pattern: 5 hours of sleep; pattern wakes up once or twice Exercise: Work physical but no regimen No concerns   Subjective    Linda Duran is a 23 y.o. female who presents today for a complete physical exam.   Discussed the use of AI scribe software for clinical note transcription with the patient, who gave verbal consent to proceed.  History of Present Illness Linda Duran is a 23 year old female who presents with tiredness, anxiety, and depression.  She continues to experience tiredness, anxiety, and depression. She is under the care of a psychiatrist and therapist, with her medication regimen including Abilify 5 mg, Strattera 60 mg, and Lamictal 100 mg. Her diet is adequate, but she does not engage in regular exercise. Alcohol consumption is limited to one drink two days ago, and she uses marijuana approximately twice a week. She has not started vitamin D3 supplementation due to dosage uncertainty. Mild nasal congestion is present, likely due to allergies. Her menstrual periods are absent due to an IUD placed in February 2022. No vaginal discharge or urinary problems. She has one partner and was checked for STDs during her pregnancy.    Last depression screening scores    03/16/2024    3:15 PM 02/26/2022    1:56 PM  PHQ 2/9 Scores  PHQ - 2 Score 5 3  PHQ- 9 Score 18 14   Last fall risk screening    03/16/2024    3:15 PM  Fall Risk   Falls in the past year? 0  Number falls in past yr: 0  Injury with Fall? 0  Risk for fall due to : No Fall Risks  Follow up Falls evaluation completed   Last Audit-C alcohol use screening    05/03/2024    8:15 AM  Alcohol Use Disorder Test (AUDIT)  1. How often do you have a drink containing alcohol? 3   2. How many drinks containing alcohol do you have on a typical day when you are drinking? 2  3. How often do you have six or more drinks on one occasion? 2  AUDIT-C Score 7   4. How often during the last year have you found that you were not able to stop drinking once you had started? 2  5. How often during the last year have you failed to do what was normally expected from you because of drinking? 0  6. How often during the last year have you needed a first drink in the morning to get yourself going after a heavy drinking session? 0  7. How often during the last year have you had a feeling of guilt of remorse after drinking? 2  8. How often during the last year have you been unable to remember what happened the night before because you had been drinking? 2  9. Have you or someone else been injured as a result of your drinking? 0  10. Has a relative or friend or a doctor or another health worker been concerned about your drinking or suggested you cut down? 0  Alcohol Use Disorder Identification Test Final Score (AUDIT) 13      Patient-reported   A score of 3 or more in women, and 4  or more in men indicates increased risk for alcohol abuse, EXCEPT if all of the points are from question 1   Past Medical History:  Diagnosis Date   Anxiety    Depression    History reviewed. No pertinent surgical history. Social History   Socioeconomic History   Marital status: Legally Separated    Spouse name: Not on file   Number of children: 1   Years of education: Not on file   Highest education level: Some college, no degree  Occupational History   Not on file  Tobacco Use   Smoking status: Never   Smokeless tobacco: Never  Vaping Use   Vaping status: Never Used  Substance and Sexual Activity   Alcohol use: Yes    Comment: Less than weekly   Drug use: Yes    Types: Marijuana    Comment: Currently   Sexual activity: Yes    Birth control/protection: I.U.D.  Other Topics Concern   Not on  file  Social History Narrative   Lives with her daughter    Social Drivers of Health   Financial Resource Strain: Medium Risk (05/03/2024)   Overall Financial Resource Strain (CARDIA)    Difficulty of Paying Living Expenses: Somewhat hard  Food Insecurity: No Food Insecurity (05/03/2024)   Hunger Vital Sign    Worried About Running Out of Food in the Last Year: Never true    Ran Out of Food in the Last Year: Never true  Transportation Needs: No Transportation Needs (05/03/2024)   PRAPARE - Administrator, Civil Service (Medical): No    Lack of Transportation (Non-Medical): No  Physical Activity: Inactive (05/03/2024)   Exercise Vital Sign    Days of Exercise per Week: 0 days    Minutes of Exercise per Session: Not on file  Stress: Stress Concern Present (05/03/2024)   [employer] of Occupational Health - Occupational Stress Questionnaire    Feeling of Stress: Very much  Social Connections: Moderately Isolated (05/03/2024)   Social Connection and Isolation Panel    Frequency of Communication with Friends and Family: More than three times a week    Frequency of Social Gatherings with Friends and Family: Twice a week    Attends Religious Services: 1 to 4 times per year    Active Member of [employer] or Organizations: No    Attends Banker Meetings: Not on file    Marital Status: Separated  Intimate Partner Violence: Not At Risk (05/03/2024)   Humiliation, Afraid, Rape, and Kick questionnaire    Fear of Current or Ex-Partner: No    Emotionally Abused: No    Physically Abused: No    Sexually Abused: No   Family Status  Relation Name Status   Mother  Alive   Father Rheanna Sergent Alive   PGM Ronal Liberty Alive  No partnership data on file   Family History  Problem Relation Age of Onset   Depression Father    Stroke Paternal Grandmother    No Known Allergies  Patient Care Team: Catina Nuss, PA-C as PCP - General (Physician Assistant)    Medications: Outpatient Medications Prior to Visit  Medication Sig   ARIPiprazole (ABILIFY) 5 MG tablet Take 5 mg by mouth daily.   atomoxetine (STRATTERA) 60 MG capsule Take 60 mg by mouth daily.   lamoTRIgine (LAMICTAL) 100 MG tablet Take 100 mg by mouth 2 (two) times daily.   levonorgestrel  (MIRENA ) 20 MCG/DAY IUD 1 each by Intrauterine route once.  No facility-administered medications prior to visit.    Review of Systems  All other systems reviewed and are negative.  Except see HPI     Objective    BP 111/80   Pulse 99   Resp 16   Ht 5' 4 (1.626 m)   Wt 201 lb (91.2 kg)   SpO2 99%   BMI 34.50 kg/m      Physical Exam Vitals reviewed.  Constitutional:      General: She is not in acute distress.    Appearance: Normal appearance. She is well-developed. She is not ill-appearing, toxic-appearing or diaphoretic.  HENT:     Head: Normocephalic and atraumatic.     Right Ear: Tympanic membrane, ear canal and external ear normal.     Left Ear: Tympanic membrane, ear canal and external ear normal.     Nose: Nose normal. No congestion or rhinorrhea.     Mouth/Throat:     Mouth: Mucous membranes are moist.     Pharynx: Oropharynx is clear. No oropharyngeal exudate.  Eyes:     General: No scleral icterus.       Right eye: No discharge.        Left eye: No discharge.     Conjunctiva/sclera: Conjunctivae normal.     Pupils: Pupils are equal, round, and reactive to light.  Neck:     Thyroid : No thyromegaly.     Vascular: No carotid bruit.  Cardiovascular:     Rate and Rhythm: Normal rate and regular rhythm.     Pulses: Normal pulses.     Heart sounds: Normal heart sounds. No murmur heard.    No friction rub. No gallop.  Pulmonary:     Effort: Pulmonary effort is normal. No respiratory distress.     Breath sounds: Normal breath sounds. No wheezing or rales.  Abdominal:     General: Abdomen is flat. Bowel sounds are normal. There is no distension.     Palpations:  Abdomen is soft. There is no mass.     Tenderness: There is no abdominal tenderness. There is no right CVA tenderness, left CVA tenderness, guarding or rebound.     Hernia: No hernia is present.  Musculoskeletal:        General: No swelling, tenderness, deformity or signs of injury. Normal range of motion.     Cervical back: Normal range of motion and neck supple. No rigidity or tenderness.     Right lower leg: No edema.     Left lower leg: No edema.  Lymphadenopathy:     Cervical: No cervical adenopathy.  Skin:    General: Skin is warm and dry.     Capillary Refill: Capillary refill takes less than 2 seconds.     Coloration: Skin is not jaundiced or pale.     Findings: No bruising, erythema, lesion or rash.  Neurological:     Mental Status: She is alert and oriented to person, place, and time. Mental status is at baseline.     Gait: Gait normal.  Psychiatric:        Mood and Affect: Mood normal.        Behavior: Behavior normal.        Thought Content: Thought content normal.        Judgment: Judgment normal.      No results found for any visits on 05/03/24.  Assessment & Plan    Routine Health Maintenance and Physical Exam  Exercise Activities and Dietary recommendations  Goals  None     Immunization History  Administered Date(s) Administered   DTaP 01/06/2006   Dtap, Unspecified 03/08/2001, 05/10/2001, 08/02/2001, 03/29/2002   HIB, Unspecified 03/08/2001, 05/10/2001, 08/02/2001, 03/29/2002   HPV 9-valent 05/18/2014   HPV Quadrivalent 11/01/2013, 01/03/2014   Hep B, Unspecified May 06, 2001, 03/08/2001, 11/09/2001   Hepatitis A, Ped/Adol-2 Dose 01/22/2012, 09/21/2018   IPV 01/06/2006   Influenza, Seasonal, Injecte, Preservative Fre 04/15/2023, 03/16/2024   Influenza,inj,Quad PF,6+ Mos 05/18/2014, 07/08/2021   MMR 03/29/2002, 01/06/2006   Meningococcal B, OMV 09/21/2018   Meningococcal Conjugate 09/21/2018   PFIZER Comirnaty(Gray Top)Covid-19 Tri-Sucrose Vaccine  01/22/2021   PFIZER(Purple Top)SARS-COV-2 Vaccination 02/13/2020, 03/11/2020   Pfizer(Comirnaty)Fall Seasonal Vaccine 12 years and older 04/15/2023   Pneumococcal Conjugate PCV 7 05/10/2001, 08/02/2001, 11/09/2001, 03/29/2002   Polio, Unspecified 03/08/2001, 05/10/2001, 08/02/2001   Tdap 01/22/2012, 04/25/2020   Varicella 03/29/2002, 01/06/2006    Health Maintenance  Topic Date Due   Meningitis B Vaccine (2 of 2 - Bexsero SCDM 2-dose series) 03/24/2019   Chlamydia screening  03/03/2024   COVID-19 Vaccine (5 - 2025-26 season) 03/13/2024   Hepatitis C Screening  03/16/2025*   HIV Screening  03/16/2025*   Pap Smear  03/05/2025   DTaP/Tdap/Td vaccine (8 - Td or Tdap) 04/25/2030   Flu Shot  Completed   HPV Vaccine  Completed   Pneumococcal Vaccine  Aged Out  *Topic was postponed. The date shown is not the original due date.    Discussed health benefits of physical activity, and encouraged her to engage in regular exercise appropriate for her age and condition.  Assessment & Plan Anxiety and Depression Chronic and stable Continued management with psychiatric and therapeutic consultations. Emphasized communication on medication changes. - Continue Abilify 5 mg, Strattera 60 mg, Lamictal 100 mg. - Encourage 150 minutes of physical activity per week. Will follow-up  Thyroid  Enlargement Possible enlargement, ultrasound recommended for baseline and nodule assessment. - Order thyroid  ultrasound for baseline and nodule assessment. Normal tsh Will follow-up  Vitamin D  insuffiency Recommended vitamin D3 supplementation for bone health and well-being. - Initiate vitamin D3 supplementation, 1000 to 2000 units daily. Will follow-up  Mild Sleep Apnea Mild sleep apnea diagnosed, no CPAP required. Crowded pharynx noted, no further intervention needed.  General Health Maintenance Up to date with eye exam, IUD placed February 2022. STD screening to be addressed with OB/GYN. - Discuss STD  screening with OB/GYN during next physical.  Follow-up Plan to monitor chronic conditions and schedule routine examinations. - Schedule follow-up for chronic conditions in six months. - Schedule annual physical examination in one year.   Goiter (Primary)  - US  THYROID ; Future  Annual physical exam UTD on dental/eye Things to do to keep yourself healthy  - Exercise at least 30-45 minutes a day, 3-4 days a week.  - Eat a low-fat diet with lots of fruits and vegetables, up to 7-9 servings per day.  - Seatbelts can save your life. Wear them always.  - Smoke detectors on every level of your home, check batteries every year.  - Eye Doctor - have an eye exam every 1-2 years  - Safe sex - if you may be exposed to STDs, use a condom.  - Alcohol -  If you drink, do it moderately, less than 2 drinks per day.  - Health Care Power of Attorney. Choose someone to speak for you if you are not able.  - Depression is common in our stressful world.If you're feeling down or losing interest in things you normally enjoy,  please come in for a visit.  - Violence - If anyone is threatening or hurting you, please call immediately.  Alcohol abuse Last  drink 2 days ago No more than 1 cup Cessation advised Will follow-up  Cannabis abuse Twice weekly Cessation encouraged Will follow-up  No follow-ups on file.    The patient was advised to call back or seek an in-person evaluation if the symptoms worsen or if the condition fails to improve as anticipated.  I discussed the assessment and treatment plan with the patient. The patient was provided an opportunity to ask questions and all were answered. The patient agreed with the plan and demonstrated an understanding of the instructions.  I, Harjot Zavadil, PA-C have reviewed all documentation for this visit. The documentation on 05/03/2024  for the exam, diagnosis, procedures, and orders are all accurate and complete.  Jolynn Spencer, [facility], MMS [facility] [phone] (phone) [phone] (fax)  [facility]

## 2024-05-25 DIAGNOSIS — F439 Reaction to severe stress, unspecified: Secondary | ICD-10-CM | POA: Diagnosis not present

## 2024-05-25 DIAGNOSIS — F411 Generalized anxiety disorder: Secondary | ICD-10-CM | POA: Diagnosis not present

## 2024-05-25 DIAGNOSIS — F331 Major depressive disorder, recurrent, moderate: Secondary | ICD-10-CM | POA: Diagnosis not present

## 2024-06-05 DIAGNOSIS — F411 Generalized anxiety disorder: Secondary | ICD-10-CM | POA: Diagnosis not present

## 2024-06-05 DIAGNOSIS — F439 Reaction to severe stress, unspecified: Secondary | ICD-10-CM | POA: Diagnosis not present

## 2024-06-05 DIAGNOSIS — F331 Major depressive disorder, recurrent, moderate: Secondary | ICD-10-CM | POA: Diagnosis not present

## 2024-06-07 DIAGNOSIS — F4312 Post-traumatic stress disorder, chronic: Secondary | ICD-10-CM | POA: Diagnosis not present

## 2024-06-07 DIAGNOSIS — F331 Major depressive disorder, recurrent, moderate: Secondary | ICD-10-CM | POA: Diagnosis not present

## 2024-06-07 DIAGNOSIS — F1010 Alcohol abuse, uncomplicated: Secondary | ICD-10-CM | POA: Diagnosis not present

## 2024-06-07 DIAGNOSIS — F411 Generalized anxiety disorder: Secondary | ICD-10-CM | POA: Diagnosis not present

## 2024-06-23 DIAGNOSIS — F411 Generalized anxiety disorder: Secondary | ICD-10-CM | POA: Diagnosis not present

## 2024-06-23 DIAGNOSIS — F439 Reaction to severe stress, unspecified: Secondary | ICD-10-CM | POA: Diagnosis not present

## 2024-06-23 DIAGNOSIS — F331 Major depressive disorder, recurrent, moderate: Secondary | ICD-10-CM | POA: Diagnosis not present

## 2024-06-28 DIAGNOSIS — F331 Major depressive disorder, recurrent, moderate: Secondary | ICD-10-CM | POA: Diagnosis not present

## 2024-06-28 DIAGNOSIS — F411 Generalized anxiety disorder: Secondary | ICD-10-CM | POA: Diagnosis not present

## 2024-06-28 DIAGNOSIS — F902 Attention-deficit hyperactivity disorder, combined type: Secondary | ICD-10-CM | POA: Diagnosis not present

## 2024-06-28 DIAGNOSIS — F4312 Post-traumatic stress disorder, chronic: Secondary | ICD-10-CM | POA: Diagnosis not present

## 2024-08-03 ENCOUNTER — Telehealth: Payer: Self-pay

## 2024-08-03 NOTE — Telephone Encounter (Signed)
 Copied from CRM #[id number]. Topic: General - Call Back - No Documentation >> Aug 03, 2024  1:39 PM Sophia H wrote: Reason for CRM:  Patient states her mother let her know clinic has been trying to get in touch with her - no notes in chart. Please reach out # [phone]

## 2024-08-04 NOTE — Telephone Encounter (Signed)
 Called and no response. Lvm to call back. [id number] please ask for a clarification on what pt is needing or reasoning of phone call. Thank you!

## 2024-11-08 ENCOUNTER — Ambulatory Visit: Admitting: Physician Assistant

## 2025-05-04 ENCOUNTER — Encounter: Admitting: Physician Assistant
